# Patient Record
Sex: Female | Born: 1935 | Race: Black or African American | Hispanic: No | Smoking: Never smoker
Health system: Southern US, Community
[De-identification: ages and names within clinical notes are randomized; demographics above are authoritative.]

## PROBLEM LIST (undated history)

## (undated) DIAGNOSIS — M1990 Unspecified osteoarthritis, unspecified site: Secondary | ICD-10-CM

## (undated) DIAGNOSIS — K5790 Diverticulosis of intestine, part unspecified, without perforation or abscess without bleeding: Secondary | ICD-10-CM

## (undated) DIAGNOSIS — D649 Anemia, unspecified: Secondary | ICD-10-CM

## (undated) DIAGNOSIS — I509 Heart failure, unspecified: Secondary | ICD-10-CM

## (undated) DIAGNOSIS — C50919 Malignant neoplasm of unspecified site of unspecified female breast: Secondary | ICD-10-CM

## (undated) DIAGNOSIS — N200 Calculus of kidney: Secondary | ICD-10-CM

## (undated) DIAGNOSIS — Z9889 Other specified postprocedural states: Secondary | ICD-10-CM

## (undated) DIAGNOSIS — C169 Malignant neoplasm of stomach, unspecified: Secondary | ICD-10-CM

## (undated) DIAGNOSIS — E785 Hyperlipidemia, unspecified: Secondary | ICD-10-CM

## (undated) DIAGNOSIS — G4733 Obstructive sleep apnea (adult) (pediatric): Secondary | ICD-10-CM

## (undated) DIAGNOSIS — I10 Essential (primary) hypertension: Secondary | ICD-10-CM

## (undated) DIAGNOSIS — C9110 Chronic lymphocytic leukemia of B-cell type not having achieved remission: Secondary | ICD-10-CM

## (undated) DIAGNOSIS — K219 Gastro-esophageal reflux disease without esophagitis: Secondary | ICD-10-CM

## (undated) DIAGNOSIS — M109 Gout, unspecified: Secondary | ICD-10-CM

## (undated) DIAGNOSIS — Z8679 Personal history of other diseases of the circulatory system: Secondary | ICD-10-CM

## (undated) HISTORY — DX: Malignant neoplasm of unspecified site of unspecified female breast: C50.919

## (undated) HISTORY — DX: Obstructive sleep apnea (adult) (pediatric): G47.33

## (undated) HISTORY — DX: Diverticulosis of intestine, part unspecified, without perforation or abscess without bleeding: K57.90

## (undated) HISTORY — DX: Gastro-esophageal reflux disease without esophagitis: K21.9

## (undated) HISTORY — DX: Other specified postprocedural states: Z98.890

## (undated) HISTORY — PX: LITHOTRIPSY: SUR834

## (undated) HISTORY — DX: Heart failure, unspecified: I50.9

## (undated) HISTORY — PX: BREAST LUMPECTOMY: SHX2

## (undated) HISTORY — DX: Calculus of kidney: N20.0

## (undated) HISTORY — DX: Personal history of other diseases of the circulatory system: Z86.79

## (undated) HISTORY — DX: Unspecified osteoarthritis, unspecified site: M19.90

## (undated) HISTORY — DX: Essential (primary) hypertension: I10

## (undated) HISTORY — DX: Chronic lymphocytic leukemia of B-cell type not having achieved remission: C91.10

## (undated) HISTORY — PX: TOTAL ABDOMINAL HYSTERECTOMY W/ BILATERAL SALPINGOOPHORECTOMY: SHX83

## (undated) HISTORY — PX: PARTIAL HIP ARTHROPLASTY: SHX733

## (undated) HISTORY — DX: Hyperlipidemia, unspecified: E78.5

## (undated) HISTORY — DX: Gout, unspecified: M10.9

## (undated) HISTORY — PX: ABDOMINAL HYSTERECTOMY: SHX81

## (undated) HISTORY — PX: PORTACATH PLACEMENT: SHX2246

## (undated) HISTORY — PX: CATARACT EXTRACTION, BILATERAL: SHX1313

## (undated) HISTORY — DX: Malignant neoplasm of stomach, unspecified: C16.9

---

## 2005-05-12 ENCOUNTER — Ambulatory Visit: Payer: Self-pay | Admitting: Internal Medicine

## 2005-07-05 ENCOUNTER — Ambulatory Visit: Payer: Self-pay | Admitting: Internal Medicine

## 2005-12-06 ENCOUNTER — Ambulatory Visit: Payer: Self-pay | Admitting: Internal Medicine

## 2006-01-03 ENCOUNTER — Ambulatory Visit: Payer: Self-pay | Admitting: Internal Medicine

## 2006-04-12 ENCOUNTER — Ambulatory Visit: Payer: Self-pay | Admitting: Gastroenterology

## 2006-05-23 ENCOUNTER — Ambulatory Visit: Payer: Self-pay | Admitting: Internal Medicine

## 2006-07-09 ENCOUNTER — Ambulatory Visit: Payer: Self-pay | Admitting: Internal Medicine

## 2007-04-11 ENCOUNTER — Ambulatory Visit: Payer: Self-pay | Admitting: Internal Medicine

## 2007-06-10 ENCOUNTER — Ambulatory Visit: Payer: Self-pay | Admitting: Internal Medicine

## 2007-06-17 ENCOUNTER — Ambulatory Visit: Payer: Self-pay | Admitting: Internal Medicine

## 2007-12-16 ENCOUNTER — Ambulatory Visit: Payer: Self-pay | Admitting: Internal Medicine

## 2007-12-24 ENCOUNTER — Ambulatory Visit: Payer: Self-pay | Admitting: Internal Medicine

## 2008-01-02 ENCOUNTER — Ambulatory Visit: Payer: Self-pay | Admitting: Internal Medicine

## 2008-02-11 ENCOUNTER — Ambulatory Visit: Payer: Self-pay | Admitting: Urology

## 2008-03-10 ENCOUNTER — Other Ambulatory Visit: Payer: Self-pay

## 2008-03-10 ENCOUNTER — Ambulatory Visit: Payer: Self-pay | Admitting: Urology

## 2008-03-18 ENCOUNTER — Ambulatory Visit: Payer: Self-pay | Admitting: Urology

## 2008-03-23 ENCOUNTER — Ambulatory Visit: Payer: Self-pay | Admitting: Urology

## 2008-04-02 ENCOUNTER — Ambulatory Visit: Payer: Self-pay | Admitting: Urology

## 2008-04-13 ENCOUNTER — Ambulatory Visit: Payer: Self-pay | Admitting: Urology

## 2008-04-14 ENCOUNTER — Ambulatory Visit: Payer: Self-pay | Admitting: Internal Medicine

## 2008-04-21 ENCOUNTER — Ambulatory Visit: Payer: Self-pay | Admitting: Urology

## 2008-05-04 ENCOUNTER — Ambulatory Visit: Payer: Self-pay | Admitting: Urology

## 2008-05-14 ENCOUNTER — Ambulatory Visit: Payer: Self-pay | Admitting: Urology

## 2008-05-21 ENCOUNTER — Ambulatory Visit: Payer: Self-pay | Admitting: Urology

## 2008-06-02 ENCOUNTER — Ambulatory Visit: Payer: Self-pay | Admitting: Urology

## 2008-06-18 ENCOUNTER — Ambulatory Visit: Payer: Self-pay | Admitting: Urology

## 2008-06-25 ENCOUNTER — Ambulatory Visit: Payer: Self-pay | Admitting: Urology

## 2008-06-29 ENCOUNTER — Ambulatory Visit: Payer: Self-pay | Admitting: Internal Medicine

## 2008-07-09 ENCOUNTER — Ambulatory Visit: Payer: Self-pay | Admitting: Urology

## 2008-08-10 ENCOUNTER — Ambulatory Visit: Payer: Self-pay | Admitting: Urology

## 2008-09-05 ENCOUNTER — Emergency Department: Payer: Self-pay | Admitting: Emergency Medicine

## 2008-12-21 ENCOUNTER — Ambulatory Visit: Payer: Self-pay | Admitting: Urology

## 2009-01-26 ENCOUNTER — Ambulatory Visit: Payer: Self-pay | Admitting: Urology

## 2009-06-02 ENCOUNTER — Ambulatory Visit: Payer: Self-pay | Admitting: Family Medicine

## 2009-06-18 ENCOUNTER — Ambulatory Visit: Payer: Self-pay | Admitting: Orthopedic Surgery

## 2009-07-06 ENCOUNTER — Inpatient Hospital Stay: Payer: Self-pay | Admitting: Orthopedic Surgery

## 2009-09-02 ENCOUNTER — Ambulatory Visit: Payer: Self-pay | Admitting: Family Medicine

## 2010-01-21 ENCOUNTER — Ambulatory Visit: Payer: Self-pay | Admitting: Urology

## 2011-01-23 ENCOUNTER — Ambulatory Visit: Payer: Self-pay | Admitting: Urology

## 2011-04-12 ENCOUNTER — Ambulatory Visit: Payer: Self-pay | Admitting: Ophthalmology

## 2011-05-17 ENCOUNTER — Ambulatory Visit: Payer: Self-pay | Admitting: Ophthalmology

## 2011-09-11 ENCOUNTER — Ambulatory Visit: Payer: Self-pay | Admitting: Internal Medicine

## 2011-09-25 ENCOUNTER — Inpatient Hospital Stay: Payer: Self-pay | Admitting: Specialist

## 2011-09-25 LAB — COMPREHENSIVE METABOLIC PANEL
Alkaline Phosphatase: 60 U/L (ref 50–136)
Anion Gap: 13 (ref 7–16)
BUN: 22 mg/dL — ABNORMAL HIGH (ref 7–18)
Bilirubin,Total: 0.9 mg/dL (ref 0.2–1.0)
Calcium, Total: 9.5 mg/dL (ref 8.5–10.1)
Creatinine: 1.98 mg/dL — ABNORMAL HIGH (ref 0.60–1.30)
EGFR (Non-African Amer.): 23 — ABNORMAL LOW
Glucose: 125 mg/dL — ABNORMAL HIGH (ref 65–99)
Osmolality: 290 (ref 275–301)
SGPT (ALT): 11 U/L — ABNORMAL LOW
Sodium: 143 mmol/L (ref 136–145)

## 2011-09-25 LAB — URINALYSIS, COMPLETE
Glucose,UR: NEGATIVE mg/dL (ref 0–75)
RBC,UR: 17 /HPF (ref 0–5)
Squamous Epithelial: 4
WBC UR: 701 /HPF (ref 0–5)

## 2011-09-25 LAB — LIPASE, BLOOD: Lipase: 105 U/L (ref 73–393)

## 2011-09-25 LAB — CBC
HCT: 31.1 % — ABNORMAL LOW (ref 35.0–47.0)
HGB: 10 g/dL — ABNORMAL LOW (ref 12.0–16.0)
MCH: 25.6 pg — ABNORMAL LOW (ref 26.0–34.0)
MCHC: 32.2 g/dL (ref 32.0–36.0)
RDW: 18.3 % — ABNORMAL HIGH (ref 11.5–14.5)
WBC: 21.7 10*3/uL — ABNORMAL HIGH (ref 3.6–11.0)

## 2011-09-25 LAB — TROPONIN I: Troponin-I: <0.02 ng/mL

## 2011-09-26 LAB — CBC WITH DIFFERENTIAL/PLATELET
Basophil #: 0 10*3/uL (ref 0.0–0.1)
Basophil %: 0.1 %
Eosinophil #: 0 10*3/uL (ref 0.0–0.7)
HCT: 31.6 % — ABNORMAL LOW (ref 35.0–47.0)
Lymphocyte #: 0.9 10*3/uL — ABNORMAL LOW (ref 1.0–3.6)
MCH: 25.4 pg — ABNORMAL LOW (ref 26.0–34.0)
MCHC: 31.2 g/dL — ABNORMAL LOW (ref 32.0–36.0)
MCV: 81 fL (ref 80–100)
Monocyte %: 4.5 %
Neutrophil #: 17.4 10*3/uL — ABNORMAL HIGH (ref 1.4–6.5)
Platelet: 135 10*3/uL — ABNORMAL LOW (ref 150–440)
RBC: 3.88 10*6/uL (ref 3.80–5.20)
RDW: 18.5 % — ABNORMAL HIGH (ref 11.5–14.5)
WBC: 19.1 10*3/uL — ABNORMAL HIGH (ref 3.6–11.0)

## 2011-09-26 LAB — BASIC METABOLIC PANEL
Anion Gap: 10 (ref 7–16)
Chloride: 111 mmol/L — ABNORMAL HIGH (ref 98–107)
Creatinine: 1.43 mg/dL — ABNORMAL HIGH (ref 0.60–1.30)
EGFR (African American): 39 — ABNORMAL LOW
EGFR (Non-African Amer.): 34 — ABNORMAL LOW
Glucose: 103 mg/dL — ABNORMAL HIGH (ref 65–99)
Potassium: 3.9 mmol/L (ref 3.5–5.1)
Sodium: 143 mmol/L (ref 136–145)

## 2011-09-26 LAB — MAGNESIUM: Magnesium: 1.8 mg/dL

## 2011-09-27 LAB — CBC WITH DIFFERENTIAL/PLATELET
Basophil #: 0 10*3/uL (ref 0.0–0.1)
Basophil %: 0.2 %
Eosinophil #: 0.1 10*3/uL (ref 0.0–0.7)
Eosinophil %: 1.2 %
HCT: 29.3 % — ABNORMAL LOW (ref 35.0–47.0)
HGB: 9.4 g/dL — ABNORMAL LOW (ref 12.0–16.0)
Lymphocyte #: 1 10*3/uL (ref 1.0–3.6)
Lymphocyte %: 9.8 %
MCH: 25.7 pg — ABNORMAL LOW (ref 26.0–34.0)
MCHC: 32.1 g/dL (ref 32.0–36.0)
MCV: 80 fL (ref 80–100)
Monocyte #: 0.8 x10 3/mm (ref 0.2–0.9)
Monocyte %: 8.4 %
Neutrophil #: 7.9 10*3/uL — ABNORMAL HIGH (ref 1.4–6.5)
Neutrophil %: 80.4 %
Platelet: 135 10*3/uL — ABNORMAL LOW (ref 150–440)
RBC: 3.65 10*6/uL — ABNORMAL LOW (ref 3.80–5.20)
RDW: 18.4 % — ABNORMAL HIGH (ref 11.5–14.5)
WBC: 9.9 10*3/uL (ref 3.6–11.0)

## 2011-09-27 LAB — BASIC METABOLIC PANEL
Anion Gap: 13 (ref 7–16)
BUN: 19 mg/dL — ABNORMAL HIGH (ref 7–18)
Calcium, Total: 8.9 mg/dL (ref 8.5–10.1)
Chloride: 110 mmol/L — ABNORMAL HIGH (ref 98–107)
Co2: 21 mmol/L (ref 21–32)
Creatinine: 1.36 mg/dL — ABNORMAL HIGH (ref 0.60–1.30)
EGFR (African American): 42 — ABNORMAL LOW
EGFR (Non-African Amer.): 36 — ABNORMAL LOW
Glucose: 88 mg/dL (ref 65–99)
Osmolality: 289 (ref 275–301)
Potassium: 4.1 mmol/L (ref 3.5–5.1)
Sodium: 144 mmol/L (ref 136–145)

## 2011-09-28 LAB — CBC WITH DIFFERENTIAL/PLATELET
Basophil #: 0 10*3/uL (ref 0.0–0.1)
Basophil %: 0.1 %
Eosinophil #: 0 10*3/uL (ref 0.0–0.7)
Eosinophil %: 0 %
HCT: 33 % — ABNORMAL LOW (ref 35.0–47.0)
HGB: 10.5 g/dL — ABNORMAL LOW (ref 12.0–16.0)
MCH: 25.8 pg — ABNORMAL LOW (ref 26.0–34.0)
MCHC: 31.9 g/dL — ABNORMAL LOW (ref 32.0–36.0)
Monocyte #: 0.2 x10 3/mm (ref 0.2–0.9)
Monocyte %: 2.4 %
Neutrophil #: 9.4 10*3/uL — ABNORMAL HIGH (ref 1.4–6.5)
Neutrophil %: 91.1 %
Platelet: 174 10*3/uL (ref 150–440)
RBC: 4.07 10*6/uL (ref 3.80–5.20)
RDW: 18.6 % — ABNORMAL HIGH (ref 11.5–14.5)
WBC: 10.4 10*3/uL (ref 3.6–11.0)

## 2011-09-28 LAB — BASIC METABOLIC PANEL
Anion Gap: 12 (ref 7–16)
Calcium, Total: 9.5 mg/dL (ref 8.5–10.1)
Chloride: 113 mmol/L — ABNORMAL HIGH (ref 98–107)
Co2: 18 mmol/L — ABNORMAL LOW (ref 21–32)
EGFR (African American): 42 — ABNORMAL LOW
Potassium: 4.6 mmol/L (ref 3.5–5.1)

## 2011-09-29 LAB — CULTURE, BLOOD (SINGLE)

## 2011-10-01 LAB — CULTURE, BLOOD (SINGLE)

## 2011-10-02 ENCOUNTER — Ambulatory Visit: Payer: Self-pay | Admitting: Internal Medicine

## 2011-10-03 LAB — PATHOLOGY REPORT

## 2011-10-03 LAB — CULTURE, BLOOD (SINGLE)

## 2011-10-10 LAB — CBC CANCER CENTER
Basophil #: 0.1 x10 3/mm (ref 0.0–0.1)
HGB: 10.5 g/dL — ABNORMAL LOW (ref 12.0–16.0)
MCH: 25.8 pg — ABNORMAL LOW (ref 26.0–34.0)
MCHC: 32 g/dL (ref 32.0–36.0)
Monocyte #: 0.6 x10 3/mm (ref 0.2–0.9)
Monocyte %: 12.4 %
Monocytes: 10 %
Neutrophil #: 2.7 x10 3/mm (ref 1.4–6.5)
RBC: 4.08 10*6/uL (ref 3.80–5.20)
RDW: 18.9 % — ABNORMAL HIGH (ref 11.5–14.5)
Segmented Neutrophils: 47 %
Variant Lymphocyte: 2 %

## 2011-10-11 ENCOUNTER — Ambulatory Visit: Payer: Self-pay | Admitting: Internal Medicine

## 2011-10-17 ENCOUNTER — Ambulatory Visit: Payer: Self-pay | Admitting: Urology

## 2011-10-19 LAB — URINE CULTURE

## 2011-10-24 LAB — URIC ACID: Uric Acid: 5.2 mg/dL (ref 2.6–6.0)

## 2011-10-24 LAB — CBC CANCER CENTER
Basophil #: 0 x10 3/mm (ref 0.0–0.1)
Eosinophil #: 0.3 x10 3/mm (ref 0.0–0.7)
Eosinophil %: 6 %
MCHC: 31.8 g/dL — ABNORMAL LOW (ref 32.0–36.0)
MCV: 81 fL (ref 80–100)
Neutrophil #: 2.4 x10 3/mm (ref 1.4–6.5)
Neutrophil %: 55.5 %
Platelet: 197 x10 3/mm (ref 150–440)
RBC: 4.42 10*6/uL (ref 3.80–5.20)

## 2011-10-24 LAB — BASIC METABOLIC PANEL
Anion Gap: 11 (ref 7–16)
BUN: 16 mg/dL (ref 7–18)
Co2: 25 mmol/L (ref 21–32)
EGFR (African American): 48 — ABNORMAL LOW
EGFR (Non-African Amer.): 42 — ABNORMAL LOW
Glucose: 87 mg/dL (ref 65–99)
Potassium: 4.3 mmol/L (ref 3.5–5.1)
Sodium: 143 mmol/L (ref 136–145)

## 2011-10-24 LAB — HEPATIC FUNCTION PANEL A (ARMC)
Alkaline Phosphatase: 91 U/L (ref 50–136)
Bilirubin, Direct: 0.2 mg/dL (ref 0.00–0.20)
Bilirubin,Total: 0.5 mg/dL (ref 0.2–1.0)
SGPT (ALT): 23 U/L

## 2011-11-01 LAB — BASIC METABOLIC PANEL
BUN: 16 mg/dL (ref 7–18)
Co2: 23 mmol/L (ref 21–32)
Creatinine: 1.3 mg/dL (ref 0.60–1.30)
EGFR (African American): 44 — ABNORMAL LOW
EGFR (Non-African Amer.): 38 — ABNORMAL LOW
Glucose: 93 mg/dL (ref 65–99)
Potassium: 4.4 mmol/L (ref 3.5–5.1)

## 2011-11-01 LAB — CBC CANCER CENTER
Basophil %: 1.2 %
Eosinophil #: 0.1 x10 3/mm (ref 0.0–0.7)
Eosinophil %: 2.3 %
HCT: 32.7 % — ABNORMAL LOW (ref 35.0–47.0)
HGB: 10.5 g/dL — ABNORMAL LOW (ref 12.0–16.0)
Lymphocyte #: 1.4 x10 3/mm (ref 1.0–3.6)
Lymphocyte %: 26.2 %
MCV: 81 fL (ref 80–100)
Monocyte #: 0.6 x10 3/mm (ref 0.2–0.9)
Neutrophil #: 3.1 x10 3/mm (ref 1.4–6.5)
WBC: 5.2 x10 3/mm (ref 3.6–11.0)

## 2011-11-02 ENCOUNTER — Ambulatory Visit: Payer: Self-pay | Admitting: Urology

## 2011-11-03 LAB — URINE CULTURE

## 2011-11-08 LAB — CBC CANCER CENTER
Basophil %: 0.6 %
Eosinophil #: 0.1 x10 3/mm (ref 0.0–0.7)
HCT: 36.9 % (ref 35.0–47.0)
HGB: 11.7 g/dL — ABNORMAL LOW (ref 12.0–16.0)
Lymphocyte #: 1.2 x10 3/mm (ref 1.0–3.6)
Lymphocyte %: 25 %
MCHC: 31.7 g/dL — ABNORMAL LOW (ref 32.0–36.0)
Monocyte #: 0.5 x10 3/mm (ref 0.2–0.9)
Monocyte %: 10.1 %
Neutrophil #: 3.1 x10 3/mm (ref 1.4–6.5)
Neutrophil %: 62.4 %
Platelet: 207 x10 3/mm (ref 150–440)
RBC: 4.5 10*6/uL (ref 3.80–5.20)
RDW: 19.6 % — ABNORMAL HIGH (ref 11.5–14.5)
WBC: 4.9 x10 3/mm (ref 3.6–11.0)

## 2011-11-08 LAB — BASIC METABOLIC PANEL
BUN: 14 mg/dL (ref 7–18)
Calcium, Total: 9.7 mg/dL (ref 8.5–10.1)
EGFR (African American): 46 — ABNORMAL LOW
EGFR (Non-African Amer.): 40 — ABNORMAL LOW
Glucose: 84 mg/dL (ref 65–99)
Osmolality: 290 (ref 275–301)
Potassium: 4.1 mmol/L (ref 3.5–5.1)
Sodium: 146 mmol/L — ABNORMAL HIGH (ref 136–145)

## 2011-11-11 ENCOUNTER — Ambulatory Visit: Payer: Self-pay | Admitting: Internal Medicine

## 2011-11-14 ENCOUNTER — Ambulatory Visit: Payer: Self-pay | Admitting: Urology

## 2011-11-15 LAB — BASIC METABOLIC PANEL
Anion Gap: 7 (ref 7–16)
BUN: 14 mg/dL (ref 7–18)
Calcium, Total: 9.9 mg/dL (ref 8.5–10.1)
EGFR (African American): 46 — ABNORMAL LOW
EGFR (Non-African Amer.): 40 — ABNORMAL LOW
Glucose: 86 mg/dL (ref 65–99)
Potassium: 4.5 mmol/L (ref 3.5–5.1)

## 2011-11-15 LAB — CBC CANCER CENTER
Basophil #: 0 x10 3/mm (ref 0.0–0.1)
Eosinophil %: 2.9 %
HGB: 11.8 g/dL — ABNORMAL LOW (ref 12.0–16.0)
Lymphocyte #: 1.2 x10 3/mm (ref 1.0–3.6)
Lymphocyte %: 26.3 %
MCHC: 31.7 g/dL — ABNORMAL LOW (ref 32.0–36.0)
Monocyte %: 10.6 %
Neutrophil #: 2.6 x10 3/mm (ref 1.4–6.5)
RBC: 4.47 10*6/uL (ref 3.80–5.20)
RDW: 19.2 % — ABNORMAL HIGH (ref 11.5–14.5)
WBC: 4.4 x10 3/mm (ref 3.6–11.0)

## 2011-11-22 LAB — CBC CANCER CENTER
Basophil %: 0.8 %
Eosinophil #: 0.1 x10 3/mm (ref 0.0–0.7)
Eosinophil %: 2.5 %
HCT: 34.2 % — ABNORMAL LOW (ref 35.0–47.0)
HGB: 10.8 g/dL — ABNORMAL LOW (ref 12.0–16.0)
Lymphocyte #: 1 x10 3/mm (ref 1.0–3.6)
MCH: 26 pg (ref 26.0–34.0)
Monocyte #: 0.5 x10 3/mm (ref 0.2–0.9)
Monocyte %: 11.4 %
Neutrophil #: 2.8 x10 3/mm (ref 1.4–6.5)
Neutrophil %: 62.9 %
RBC: 4.16 10*6/uL (ref 3.80–5.20)

## 2011-11-28 ENCOUNTER — Ambulatory Visit: Payer: Self-pay | Admitting: Vascular Surgery

## 2011-11-29 LAB — CBC CANCER CENTER
Basophil #: 0 x10 3/mm (ref 0.0–0.1)
Basophil %: 0.7 %
Eosinophil #: 0.1 x10 3/mm (ref 0.0–0.7)
HCT: 34.7 % — ABNORMAL LOW (ref 35.0–47.0)
HGB: 11 g/dL — ABNORMAL LOW (ref 12.0–16.0)
Lymphocyte %: 22.6 %
MCH: 26.1 pg (ref 26.0–34.0)
MCHC: 31.8 g/dL — ABNORMAL LOW (ref 32.0–36.0)
Monocyte #: 0.7 x10 3/mm (ref 0.2–0.9)
Monocyte %: 12.6 %
Neutrophil #: 3.6 x10 3/mm (ref 1.4–6.5)
WBC: 5.8 x10 3/mm (ref 3.6–11.0)

## 2011-12-06 LAB — CBC CANCER CENTER
Basophil %: 0.5 %
Eosinophil #: 0.1 x10 3/mm (ref 0.0–0.7)
Eosinophil %: 2.9 %
HCT: 31.4 % — ABNORMAL LOW (ref 35.0–47.0)
MCV: 83 fL (ref 80–100)
Monocyte #: 0.6 x10 3/mm (ref 0.2–0.9)
Monocyte %: 11.7 %
Neutrophil #: 3 x10 3/mm (ref 1.4–6.5)
RBC: 3.79 10*6/uL — ABNORMAL LOW (ref 3.80–5.20)
RDW: 18.6 % — ABNORMAL HIGH (ref 11.5–14.5)

## 2011-12-06 LAB — BASIC METABOLIC PANEL
Anion Gap: 8 (ref 7–16)
Calcium, Total: 9.1 mg/dL (ref 8.5–10.1)
Co2: 25 mmol/L (ref 21–32)
Creatinine: 1.15 mg/dL (ref 0.60–1.30)
EGFR (African American): 51 — ABNORMAL LOW
Glucose: 99 mg/dL (ref 65–99)
Osmolality: 287 (ref 275–301)
Potassium: 4.1 mmol/L (ref 3.5–5.1)
Sodium: 145 mmol/L (ref 136–145)

## 2011-12-11 ENCOUNTER — Ambulatory Visit: Payer: Self-pay | Admitting: Internal Medicine

## 2011-12-20 LAB — CBC CANCER CENTER
Basophil #: 0 x10 3/mm (ref 0.0–0.1)
Eosinophil #: 0.1 x10 3/mm (ref 0.0–0.7)
HCT: 30.7 % — ABNORMAL LOW (ref 35.0–47.0)
MCH: 26.2 pg (ref 26.0–34.0)
MCHC: 31.7 g/dL — ABNORMAL LOW (ref 32.0–36.0)
MCV: 83 fL (ref 80–100)
RDW: 18.2 % — ABNORMAL HIGH (ref 11.5–14.5)

## 2012-01-11 ENCOUNTER — Ambulatory Visit: Payer: Self-pay | Admitting: Internal Medicine

## 2012-01-17 LAB — CBC CANCER CENTER
HCT: 33.6 % — ABNORMAL LOW (ref 35.0–47.0)
Lymphocyte #: 1.1 x10 3/mm (ref 1.0–3.6)
MCHC: 32.9 g/dL (ref 32.0–36.0)
MCV: 83 fL (ref 80–100)
Neutrophil #: 2.1 x10 3/mm (ref 1.4–6.5)
RDW: 17.4 % — ABNORMAL HIGH (ref 11.5–14.5)

## 2012-01-23 ENCOUNTER — Ambulatory Visit: Payer: Self-pay | Admitting: Urology

## 2012-02-11 ENCOUNTER — Ambulatory Visit: Payer: Self-pay | Admitting: Internal Medicine

## 2012-02-23 LAB — CBC CANCER CENTER
Basophil #: 0 x10 3/mm (ref 0.0–0.1)
Basophil %: 0.5 %
Eosinophil #: 0.1 x10 3/mm (ref 0.0–0.7)
Eosinophil %: 2 %
HCT: 33.9 % — ABNORMAL LOW (ref 35.0–47.0)
Lymphocyte #: 0.9 x10 3/mm — ABNORMAL LOW (ref 1.0–3.6)
MCH: 26.3 pg (ref 26.0–34.0)
MCHC: 32 g/dL (ref 32.0–36.0)
MCV: 82 fL (ref 80–100)
Monocyte #: 0.8 x10 3/mm (ref 0.2–0.9)
Platelet: 204 x10 3/mm (ref 150–440)
RBC: 4.12 10*6/uL (ref 3.80–5.20)
RDW: 16.9 % — ABNORMAL HIGH (ref 11.5–14.5)

## 2012-02-23 LAB — HEPATIC FUNCTION PANEL A (ARMC)
Alkaline Phosphatase: 75 U/L (ref 50–136)
Bilirubin,Total: 0.4 mg/dL (ref 0.2–1.0)
SGOT(AST): 18 U/L (ref 15–37)
SGPT (ALT): 15 U/L (ref 12–78)

## 2012-02-23 LAB — CREATININE, SERUM
EGFR (African American): 41 — ABNORMAL LOW
EGFR (Non-African Amer.): 35 — ABNORMAL LOW

## 2012-03-12 ENCOUNTER — Ambulatory Visit: Payer: Self-pay

## 2012-03-12 ENCOUNTER — Ambulatory Visit: Payer: Self-pay | Admitting: Internal Medicine

## 2012-04-19 ENCOUNTER — Ambulatory Visit: Payer: Self-pay | Admitting: Urology

## 2012-05-17 ENCOUNTER — Ambulatory Visit: Payer: Self-pay | Admitting: Internal Medicine

## 2012-05-22 ENCOUNTER — Ambulatory Visit: Payer: Self-pay | Admitting: Internal Medicine

## 2012-05-22 LAB — HEPATIC FUNCTION PANEL A (ARMC)
Albumin: 4 g/dL (ref 3.4–5.0)
Alkaline Phosphatase: 91 U/L (ref 50–136)
Bilirubin,Total: 0.4 mg/dL (ref 0.2–1.0)
SGOT(AST): 13 U/L — ABNORMAL LOW (ref 15–37)
SGPT (ALT): 15 U/L (ref 12–78)
Total Protein: 8.4 g/dL — ABNORMAL HIGH (ref 6.4–8.2)

## 2012-05-22 LAB — CBC CANCER CENTER
Basophil %: 0.9 %
Eosinophil #: 0.1 x10 3/mm (ref 0.0–0.7)
Eosinophil %: 2.3 %
Lymphocyte %: 15.6 %
MCHC: 33.2 g/dL (ref 32.0–36.0)
Monocyte #: 0.6 x10 3/mm (ref 0.2–0.9)
Neutrophil #: 3.9 x10 3/mm (ref 1.4–6.5)
Neutrophil %: 70.8 %
RBC: 4.95 10*6/uL (ref 3.80–5.20)
RDW: 18 % — ABNORMAL HIGH (ref 11.5–14.5)

## 2012-05-22 LAB — CREATININE, SERUM
Creatinine: 1.28 mg/dL (ref 0.60–1.30)
EGFR (African American): 45 — ABNORMAL LOW
EGFR (Non-African Amer.): 39 — ABNORMAL LOW

## 2012-05-22 LAB — LACTATE DEHYDROGENASE: LDH: 174 U/L (ref 81–246)

## 2012-06-12 ENCOUNTER — Ambulatory Visit: Payer: Self-pay | Admitting: Internal Medicine

## 2012-06-17 LAB — CBC CANCER CENTER
Basophil #: 0.1 x10 3/mm (ref 0.0–0.1)
Basophil %: 1.5 %
Eosinophil #: 0.1 x10 3/mm (ref 0.0–0.7)
HGB: 11 g/dL — ABNORMAL LOW (ref 12.0–16.0)
Lymphocyte #: 0.9 x10 3/mm — ABNORMAL LOW (ref 1.0–3.6)
MCHC: 33 g/dL (ref 32.0–36.0)
MCV: 81 fL (ref 80–100)
Monocyte %: 14.3 %
Neutrophil #: 2.3 x10 3/mm (ref 1.4–6.5)
Neutrophil %: 58.7 %
Platelet: 190 x10 3/mm (ref 150–440)
RDW: 18.5 % — ABNORMAL HIGH (ref 11.5–14.5)
WBC: 3.9 x10 3/mm (ref 3.6–11.0)

## 2012-06-17 LAB — CREATININE, SERUM
Creatinine: 1.35 mg/dL — ABNORMAL HIGH (ref 0.60–1.30)
EGFR (African American): 42 — ABNORMAL LOW
EGFR (Non-African Amer.): 36 — ABNORMAL LOW

## 2012-06-17 LAB — HEPATIC FUNCTION PANEL A (ARMC)
Albumin: 3.4 g/dL (ref 3.4–5.0)
Alkaline Phosphatase: 84 U/L (ref 50–136)
Bilirubin, Direct: <0.05 mg/dL (ref 0.00–0.20)
SGOT(AST): 12 U/L — ABNORMAL LOW (ref 15–37)
SGPT (ALT): 18 U/L (ref 12–78)

## 2012-06-23 ENCOUNTER — Emergency Department: Payer: Self-pay | Admitting: Emergency Medicine

## 2012-06-23 LAB — COMPREHENSIVE METABOLIC PANEL
Anion Gap: 8 (ref 7–16)
BUN: 16 mg/dL (ref 7–18)
Bilirubin,Total: 0.4 mg/dL (ref 0.2–1.0)
Co2: 22 mmol/L (ref 21–32)
EGFR (African American): 53 — ABNORMAL LOW
EGFR (Non-African Amer.): 46 — ABNORMAL LOW
Glucose: 99 mg/dL (ref 65–99)
Osmolality: 282 (ref 275–301)
SGOT(AST): 33 U/L (ref 15–37)
SGPT (ALT): 15 U/L (ref 12–78)
Total Protein: 7.2 g/dL (ref 6.4–8.2)

## 2012-06-23 LAB — CBC
MCH: 26.2 pg (ref 26.0–34.0)
MCHC: 32.7 g/dL (ref 32.0–36.0)
Platelet: 181 10*3/uL (ref 150–440)
RBC: 4.18 10*6/uL (ref 3.80–5.20)

## 2012-06-23 LAB — TROPONIN I: Troponin-I: <0.02 ng/mL

## 2012-06-24 LAB — CBC CANCER CENTER
Basophil #: 0 x10 3/mm (ref 0.0–0.1)
Basophil %: 0.2 %
Eosinophil #: 0.5 x10 3/mm (ref 0.0–0.7)
HGB: 11.6 g/dL — ABNORMAL LOW (ref 12.0–16.0)
MCHC: 32.7 g/dL (ref 32.0–36.0)
Neutrophil #: 23.7 x10 3/mm — ABNORMAL HIGH (ref 1.4–6.5)
Platelet: 153 x10 3/mm (ref 150–440)
RBC: 4.46 10*6/uL (ref 3.80–5.20)
RDW: 19.2 % — ABNORMAL HIGH (ref 11.5–14.5)
WBC: 26.4 x10 3/mm — ABNORMAL HIGH (ref 3.6–11.0)

## 2012-07-01 LAB — HEPATIC FUNCTION PANEL A (ARMC)
Albumin: 3.5 g/dL (ref 3.4–5.0)
Alkaline Phosphatase: 120 U/L (ref 50–136)
Bilirubin, Direct: 0.1 mg/dL (ref 0.00–0.20)
SGOT(AST): 18 U/L (ref 15–37)
SGPT (ALT): 12 U/L (ref 12–78)

## 2012-07-01 LAB — BASIC METABOLIC PANEL
Anion Gap: 9 (ref 7–16)
BUN: 16 mg/dL (ref 7–18)
Calcium, Total: 9.5 mg/dL (ref 8.5–10.1)
Chloride: 108 mmol/L — ABNORMAL HIGH (ref 98–107)
Co2: 27 mmol/L (ref 21–32)
EGFR (Non-African Amer.): 36 — ABNORMAL LOW
Osmolality: 288 (ref 275–301)
Sodium: 144 mmol/L (ref 136–145)

## 2012-07-01 LAB — CBC CANCER CENTER
Basophil %: 0.4 %
Eosinophil #: 0.2 x10 3/mm (ref 0.0–0.7)
HCT: 34.9 % — ABNORMAL LOW (ref 35.0–47.0)
HGB: 11.5 g/dL — ABNORMAL LOW (ref 12.0–16.0)
Lymphocyte #: 0.4 x10 3/mm — ABNORMAL LOW (ref 1.0–3.6)
Lymphocyte %: 4.8 %
MCH: 26.3 pg (ref 26.0–34.0)
MCHC: 33.1 g/dL (ref 32.0–36.0)
MCV: 80 fL (ref 80–100)
Monocyte #: 0.9 x10 3/mm (ref 0.2–0.9)
RBC: 4.38 10*6/uL (ref 3.80–5.20)

## 2012-07-01 LAB — URIC ACID: Uric Acid: 6.1 mg/dL — ABNORMAL HIGH (ref 2.6–6.0)

## 2012-07-08 LAB — CBC CANCER CENTER
Basophil #: 0 x10 3/mm (ref 0.0–0.1)
Basophil %: 1 %
Eosinophil #: 0.2 x10 3/mm (ref 0.0–0.7)
HCT: 35.3 % (ref 35.0–47.0)
Lymphocyte #: 0.5 x10 3/mm — ABNORMAL LOW (ref 1.0–3.6)
MCH: 25.8 pg — ABNORMAL LOW (ref 26.0–34.0)
Monocyte #: 0.9 x10 3/mm (ref 0.2–0.9)
Neutrophil #: 1.8 x10 3/mm (ref 1.4–6.5)
Neutrophil %: 53.6 %
Platelet: 204 x10 3/mm (ref 150–440)
RBC: 4.38 10*6/uL (ref 3.80–5.20)
WBC: 3.4 x10 3/mm — ABNORMAL LOW (ref 3.6–11.0)

## 2012-07-13 ENCOUNTER — Ambulatory Visit: Payer: Self-pay | Admitting: Internal Medicine

## 2012-07-15 LAB — CBC CANCER CENTER
Basophil %: 1.3 %
Eosinophil #: 0.2 x10 3/mm (ref 0.0–0.7)
Eosinophil %: 5.9 %
HCT: 31.8 % — ABNORMAL LOW (ref 35.0–47.0)
HGB: 10.5 g/dL — ABNORMAL LOW (ref 12.0–16.0)
Lymphocyte #: 0.5 x10 3/mm — ABNORMAL LOW (ref 1.0–3.6)
Lymphocyte %: 15.5 %
Monocyte #: 0.8 x10 3/mm (ref 0.2–0.9)
Neutrophil #: 1.7 x10 3/mm (ref 1.4–6.5)
WBC: 3.2 x10 3/mm — ABNORMAL LOW (ref 3.6–11.0)

## 2012-07-15 LAB — BASIC METABOLIC PANEL
BUN: 16 mg/dL (ref 7–18)
Calcium, Total: 9.3 mg/dL (ref 8.5–10.1)
Chloride: 108 mmol/L — ABNORMAL HIGH (ref 98–107)
Co2: 26 mmol/L (ref 21–32)
EGFR (African American): 40 — ABNORMAL LOW
EGFR (Non-African Amer.): 34 — ABNORMAL LOW
Glucose: 92 mg/dL (ref 65–99)
Osmolality: 286 (ref 275–301)
Sodium: 143 mmol/L (ref 136–145)

## 2012-07-19 ENCOUNTER — Encounter: Payer: Self-pay | Admitting: *Deleted

## 2012-07-22 LAB — CBC CANCER CENTER
Basophil %: 0.9 %
Eosinophil #: 0.5 x10 3/mm (ref 0.0–0.7)
Eosinophil %: 3.1 %
HCT: 34.1 % — ABNORMAL LOW (ref 35.0–47.0)
HGB: 11.2 g/dL — ABNORMAL LOW (ref 12.0–16.0)
MCHC: 32.9 g/dL (ref 32.0–36.0)
MCV: 80 fL (ref 80–100)
Monocyte %: 10.5 %
Neutrophil #: 14.2 x10 3/mm — ABNORMAL HIGH (ref 1.4–6.5)
Platelet: 166 x10 3/mm (ref 150–440)
RDW: 19.4 % — ABNORMAL HIGH (ref 11.5–14.5)
WBC: 16.8 x10 3/mm — ABNORMAL HIGH (ref 3.6–11.0)

## 2012-07-23 ENCOUNTER — Encounter: Payer: Self-pay | Admitting: Cardiovascular Disease

## 2012-07-26 ENCOUNTER — Encounter: Payer: Self-pay | Admitting: Cardiovascular Disease

## 2012-07-29 ENCOUNTER — Encounter: Payer: Self-pay | Admitting: *Deleted

## 2012-07-29 LAB — CBC CANCER CENTER
Basophil #: 0 x10 3/mm (ref 0.0–0.1)
Basophil %: 0.2 %
Eosinophil #: 0.2 x10 3/mm (ref 0.0–0.7)
Eosinophil %: 2.2 %
HGB: 11.9 g/dL — ABNORMAL LOW (ref 12.0–16.0)
Lymphocyte #: 1.2 x10 3/mm (ref 1.0–3.6)
MCH: 26.4 pg (ref 26.0–34.0)
MCHC: 33 g/dL (ref 32.0–36.0)
MCV: 80 fL (ref 80–100)
Monocyte %: 8.8 %
Neutrophil #: 7.8 x10 3/mm — ABNORMAL HIGH (ref 1.4–6.5)
Platelet: 232 x10 3/mm (ref 150–440)
WBC: 10.1 x10 3/mm (ref 3.6–11.0)

## 2012-07-29 LAB — HEPATIC FUNCTION PANEL A (ARMC)
Albumin: 3.5 g/dL (ref 3.4–5.0)
Alkaline Phosphatase: 135 U/L (ref 50–136)
Bilirubin, Direct: 0.1 mg/dL (ref 0.00–0.20)
Bilirubin,Total: 0.3 mg/dL (ref 0.2–1.0)
SGPT (ALT): 17 U/L (ref 12–78)

## 2012-07-29 LAB — BASIC METABOLIC PANEL
Anion Gap: 8 (ref 7–16)
BUN: 17 mg/dL (ref 7–18)
Calcium, Total: 9.9 mg/dL (ref 8.5–10.1)
Chloride: 107 mmol/L (ref 98–107)
Creatinine: 1.31 mg/dL — ABNORMAL HIGH (ref 0.60–1.30)
EGFR (African American): 44 — ABNORMAL LOW
EGFR (Non-African Amer.): 38 — ABNORMAL LOW
Osmolality: 286 (ref 275–301)
Sodium: 143 mmol/L (ref 136–145)

## 2012-08-02 ENCOUNTER — Ambulatory Visit (INDEPENDENT_AMBULATORY_CARE_PROVIDER_SITE_OTHER): Payer: PRIVATE HEALTH INSURANCE | Admitting: Cardiovascular Disease

## 2012-08-02 ENCOUNTER — Encounter: Payer: Self-pay | Admitting: Cardiovascular Disease

## 2012-08-02 VITALS — BP 102/68 | HR 97 | Ht 62.0 in | Wt 164.2 lb

## 2012-08-02 DIAGNOSIS — I10 Essential (primary) hypertension: Secondary | ICD-10-CM | POA: Insufficient documentation

## 2012-08-02 DIAGNOSIS — R0600 Dyspnea, unspecified: Secondary | ICD-10-CM

## 2012-08-02 NOTE — Assessment & Plan Note (Signed)
The patient's dyspnea is worrisome for possible underlying heart failure. Currently though she reports significant improvement in her symptoms after using Lasix briefly as needed. There is no evidence of heart failure by physical exam today. Her BNP was greater than 1000 last month. She has no chest pain at the present time. I would proceed with an echocardiogram to evaluate LV systolic function. Further recommendations will be based on the results of the echocardiogram. She is currently receiving chemotherapy for lymphoma. Rituxan is known to have cardiac toxicity but not usually cardiomyopathy.

## 2012-08-02 NOTE — Patient Instructions (Addendum)
Your physician has requested that you have an echocardiogram. Echocardiography is a painless test that uses sound waves to create images of your heart. It provides your doctor with information about the size and shape of your heart and how well your heart's chambers and valves are working. This procedure takes approximately one hour. There are no restrictions for this procedure.  Follow up as needed 

## 2012-08-02 NOTE — Assessment & Plan Note (Signed)
Her blood pressure is controlled today. According to outpatient notes, she is supposed to be on carvedilol 25 mg twice daily. However, the patient does not have this on her own medications and did not recognize the name. She is currently on amlodipine 2.5 mg once daily. This will have to be verified with her.

## 2012-08-02 NOTE — Progress Notes (Signed)
Primary care physician: Dr. Ulanda Edison  HPI  This is an 79 year old African American female who was referred from the emergency room at [facility] for evaluation of dyspnea with elevated BNP and possible heart failure. 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 Sherrlyn Hock. She was also diagnosed with refractory anemia with ring sideroblasts, myelodysplastic syndrome by bone marrow biopsy on Oct 11, 2011 .  She had previous history of breast cancer more than 5 years ago, treated outside. Per the patient, she had surgery and chemotherapy and has been in remission. She was started on single-agent Rituxan on 10/25/11. In Dec 2013, she was found to have progressive disease. Started Treanda/Rituxan on 06/17/12. On January 12, she complained of increased palpitations and dyspnea. She went to the emergency room at [facility] where she was found to have an elevated BNP around 1000 a chest x-ray showed no evidence of fluid overload. Patient appeared to be stable at that time and was discharged home to followup with [place]. Since that time, she reports no significant dyspnea or palpitations. She denies any chest pain. She does feel tired and fatigue. She did have lower extremity edema which improved with small dose Lasix.   Allergies  Allergen Reactions  . Ace Inhibitors   . Aspirin   . Eggs Or Egg-Derived Products   . Milk-Related Compounds      Current Outpatient Prescriptions on File Prior to Visit  Medication Sig Dispense Refill  . amLODipine (NORVASC) 2.5 MG tablet Take 2.5 mg by mouth daily.      Marland Kitchen omeprazole (PRILOSEC) 20 MG capsule Take 20 mg by mouth daily.       No current facility-administered medications on file prior to visit.     Past Medical  History  Diagnosis Date  . HTN (hypertension)   . HLD (hyperlipidemia)   . GERD (gastroesophageal reflux disease)   . Kidney stone   . H/O lumpectomy   . Obstructive sleep apnea   . Gout   . Diverticulosis   . Nephrolithiasis     history of right lithotripsy  . History of valvular heart disease   . Breast cancer   . CLL (chronic lymphocytic leukemia)      Past Surgical History  Procedure Laterality Date  . Total abdominal hysterectomy w/ bilateral salpingoophorectomy    . Breast lumpectomy    . Partial hip arthroplasty      right  . Cataract extraction, bilateral    . Lithotripsy      right  . Portacath placement       Family History  Problem Relation Age of Onset  . Hypertension Sister      History   Social History  . Marital Status: Widowed    Spouse Name: N/A    Number of Children: N/A  . Years of Education: N/A   Occupational History  . Not on file.   Social History Main Topics  . Smoking status: Never Smoker   . Smokeless tobacco: Not on file  . Alcohol Use: No  . Drug Use: No  . Sexually Active: Not on file   Other Topics Concern  . Not on file   Social History Narrative  . No narrative on file     ROS Constitutional: Negative for fever, chills, diaphoresis, activity change, appetite change and fatigue.  HENT: Negative for hearing loss, nosebleeds, congestion, sore throat, facial swelling, drooling, trouble swallowing, neck pain, voice change, sinus pressure and tinnitus.  Eyes: Negative for photophobia, pain, discharge and visual disturbance.  Respiratory: Negative for apnea, cough, chest tightness and wheezing.  Cardiovascular: Negative for chest pain. Gastrointestinal: Negative for nausea, vomiting, abdominal pain, diarrhea, constipation, blood in stool and abdominal distention.  Genitourinary: Negative for dysuria, urgency, frequency, hematuria and decreased urine volume.  Musculoskeletal: Negative for myalgias, back pain, joint  swelling, arthralgias and gait problem.  Skin: Negative for color change, pallor, rash and wound.  Neurological: Negative for dizziness, tremors, seizures, syncope, speech difficulty, weakness, light-headedness, numbness and headaches.  Psychiatric/Behavioral: Negative for suicidal ideas, hallucinations, behavioral problems and agitation. The patient is not nervous/anxious.     PHYSICAL EXAM   BP 102/68  Pulse 97  Ht 5\' 2"  (1.575 m)  Wt 164 lb 4 oz (74.503 kg)  BMI 30.03 kg/m2 Constitutional: She is oriented to person, place, and time. She appears well-developed and well-nourished. No distress.  HENT: No nasal discharge.  Head: Normocephalic and atraumatic.  Eyes: Pupils are equal and round. Right eye exhibits no discharge. Left eye exhibits no discharge.  Neck: Normal range of motion. Neck supple. No JVD present. No thyromegaly present.  Cardiovascular: Normal rate, regular rhythm, normal heart sounds. Exam reveals no gallop and no friction rub. No murmur heard.  Pulmonary/Chest: Effort normal and breath sounds normal. No stridor. No respiratory distress. She has no wheezes. She has no rales. She exhibits no tenderness.  Abdominal: Soft. Bowel sounds are normal. She exhibits no distension. There is no tenderness. There is no rebound and no guarding.  Musculoskeletal: Normal range of motion. She exhibits no edema and no tenderness.  Neurological: She is alert and oriented to person, place, and time. Coordination normal.  Skin: Skin is warm and dry. No rash noted. She is not diaphoretic. No erythema. No pallor.  Psychiatric: She has a normal mood and affect. Her behavior is normal. Judgment and thought content normal.     EKG: Sinus  Rhythm  -Short PR  PRi = 116 -Right atrial enlargement.   BORDERLINE   ASSESSMENT AND PLAN

## 2012-08-05 LAB — CBC CANCER CENTER
Basophil #: 0 x10 3/mm (ref 0.0–0.1)
Eosinophil #: 0.1 x10 3/mm (ref 0.0–0.7)
Eosinophil %: 1.6 %
HCT: 37.2 % (ref 35.0–47.0)
HGB: 12.1 g/dL (ref 12.0–16.0)
Lymphocyte #: 1.7 x10 3/mm (ref 1.0–3.6)
MCH: 26.3 pg (ref 26.0–34.0)
MCHC: 32.4 g/dL (ref 32.0–36.0)
MCV: 81 fL (ref 80–100)
Monocyte %: 18.5 %
Neutrophil #: 2.1 x10 3/mm (ref 1.4–6.5)
Neutrophil %: 44.5 %
RBC: 4.58 10*6/uL (ref 3.80–5.20)
RDW: 19.8 % — ABNORMAL HIGH (ref 11.5–14.5)
WBC: 4.8 x10 3/mm (ref 3.6–11.0)

## 2012-08-10 ENCOUNTER — Ambulatory Visit: Payer: Self-pay | Admitting: Internal Medicine

## 2012-08-12 LAB — CBC CANCER CENTER
Basophil #: 0 x10 3/mm (ref 0.0–0.1)
Basophil %: 0.5 %
Eosinophil %: 1.8 %
MCHC: 32.6 g/dL (ref 32.0–36.0)
MCV: 81 fL (ref 80–100)
Monocyte #: 0.7 x10 3/mm (ref 0.2–0.9)
Monocyte %: 14.7 %
Neutrophil #: 2.1 x10 3/mm (ref 1.4–6.5)
Neutrophil %: 48.1 %
Platelet: 176 x10 3/mm (ref 150–440)
RBC: 4.4 10*6/uL (ref 3.80–5.20)
RDW: 19.5 % — ABNORMAL HIGH (ref 11.5–14.5)

## 2012-08-12 LAB — BASIC METABOLIC PANEL
Anion Gap: 10 (ref 7–16)
BUN: 14 mg/dL (ref 7–18)
Co2: 26 mmol/L (ref 21–32)
EGFR (African American): 41 — ABNORMAL LOW
EGFR (Non-African Amer.): 35 — ABNORMAL LOW
Glucose: 92 mg/dL (ref 65–99)
Osmolality: 289 (ref 275–301)
Potassium: 4.2 mmol/L (ref 3.5–5.1)
Sodium: 145 mmol/L (ref 136–145)

## 2012-08-19 LAB — CBC CANCER CENTER
Basophil #: 0.1 x10 3/mm (ref 0.0–0.1)
HCT: 35 % (ref 35.0–47.0)
HGB: 11.5 g/dL — ABNORMAL LOW (ref 12.0–16.0)
Lymphocyte %: 2.2 %
MCH: 26.2 pg (ref 26.0–34.0)
MCHC: 32.9 g/dL (ref 32.0–36.0)
MCV: 80 fL (ref 80–100)
Monocyte %: 11.8 %
Neutrophil #: 19.1 x10 3/mm — ABNORMAL HIGH (ref 1.4–6.5)
Neutrophil %: 84.1 %
Platelet: 158 x10 3/mm (ref 150–440)
RDW: 20.1 % — ABNORMAL HIGH (ref 11.5–14.5)

## 2012-08-26 LAB — CBC CANCER CENTER
Basophil #: 0 x10 3/mm (ref 0.0–0.1)
Basophil %: 0.7 %
Eosinophil #: 0.2 x10 3/mm (ref 0.0–0.7)
Eosinophil %: 2.3 %
HCT: 35.8 % (ref 35.0–47.0)
HGB: 11.8 g/dL — ABNORMAL LOW (ref 12.0–16.0)
Lymphocyte %: 11.1 %
MCH: 26.3 pg (ref 26.0–34.0)
MCHC: 32.9 g/dL (ref 32.0–36.0)
MCV: 80 fL (ref 80–100)
Monocyte #: 0.8 x10 3/mm (ref 0.2–0.9)
Monocyte %: 12 %
Neutrophil #: 4.9 x10 3/mm (ref 1.4–6.5)
Neutrophil %: 73.9 %
Platelet: 171 x10 3/mm (ref 150–440)
RDW: 19.7 % — ABNORMAL HIGH (ref 11.5–14.5)
WBC: 6.6 x10 3/mm (ref 3.6–11.0)

## 2012-08-26 LAB — BASIC METABOLIC PANEL
Anion Gap: 10 (ref 7–16)
Calcium, Total: 9.7 mg/dL (ref 8.5–10.1)
Chloride: 107 mmol/L (ref 98–107)
Creatinine: 1.16 mg/dL (ref 0.60–1.30)
EGFR (African American): 50 — ABNORMAL LOW
Glucose: 77 mg/dL (ref 65–99)
Osmolality: 283 (ref 275–301)
Potassium: 3.8 mmol/L (ref 3.5–5.1)

## 2012-08-26 LAB — HEPATIC FUNCTION PANEL A (ARMC)
Alkaline Phosphatase: 142 U/L — ABNORMAL HIGH (ref 50–136)
SGOT(AST): 13 U/L — ABNORMAL LOW (ref 15–37)
SGPT (ALT): 16 U/L (ref 12–78)
Total Protein: 7.3 g/dL (ref 6.4–8.2)

## 2012-09-02 LAB — CBC CANCER CENTER
Basophil #: 0 x10 3/mm (ref 0.0–0.1)
Eosinophil %: 2.9 %
Lymphocyte #: 0.9 x10 3/mm — ABNORMAL LOW (ref 1.0–3.6)
Lymphocyte %: 19.4 %
MCH: 26.5 pg (ref 26.0–34.0)
Monocyte #: 1 x10 3/mm — ABNORMAL HIGH (ref 0.2–0.9)
Monocyte %: 20.2 %
Neutrophil #: 2.7 x10 3/mm (ref 1.4–6.5)
Neutrophil %: 56.8 %
RDW: 19.6 % — ABNORMAL HIGH (ref 11.5–14.5)
WBC: 4.8 x10 3/mm (ref 3.6–11.0)

## 2012-09-09 LAB — CBC CANCER CENTER
Basophil %: 1.1 %
Eosinophil %: 5.8 %
HCT: 32.2 % — ABNORMAL LOW (ref 35.0–47.0)
MCH: 26.4 pg (ref 26.0–34.0)
MCHC: 32.1 g/dL (ref 32.0–36.0)
MCV: 82 fL (ref 80–100)
Neutrophil #: 1.4 x10 3/mm (ref 1.4–6.5)
Neutrophil %: 50.6 %
Platelet: 157 x10 3/mm (ref 150–440)
RDW: 19.5 % — ABNORMAL HIGH (ref 11.5–14.5)
WBC: 2.9 x10 3/mm — ABNORMAL LOW (ref 3.6–11.0)

## 2012-09-09 LAB — BASIC METABOLIC PANEL
BUN: 12 mg/dL (ref 7–18)
Calcium, Total: 9.4 mg/dL (ref 8.5–10.1)
EGFR (Non-African Amer.): 40 — ABNORMAL LOW
Glucose: 94 mg/dL (ref 65–99)
Osmolality: 288 (ref 275–301)
Potassium: 5 mmol/L (ref 3.5–5.1)
Sodium: 145 mmol/L (ref 136–145)

## 2012-09-10 ENCOUNTER — Ambulatory Visit: Payer: Self-pay | Admitting: Internal Medicine

## 2012-09-16 LAB — CBC CANCER CENTER
Bands: 4 %
HCT: 35.3 % (ref 35.0–47.0)
Lymphocytes: 2 %
MCH: 25.9 pg — ABNORMAL LOW (ref 26.0–34.0)
Metamyelocyte: 2 %
Monocytes: 9 %
RBC: 4.31 10*6/uL (ref 3.80–5.20)
RDW: 19.6 % — ABNORMAL HIGH (ref 11.5–14.5)

## 2012-09-17 ENCOUNTER — Other Ambulatory Visit (INDEPENDENT_AMBULATORY_CARE_PROVIDER_SITE_OTHER): Payer: PRIVATE HEALTH INSURANCE

## 2012-09-17 ENCOUNTER — Other Ambulatory Visit: Payer: Self-pay

## 2012-09-17 DIAGNOSIS — R0600 Dyspnea, unspecified: Secondary | ICD-10-CM

## 2012-09-17 DIAGNOSIS — R000 Tachycardia, unspecified: Secondary | ICD-10-CM

## 2012-09-17 DIAGNOSIS — R0989 Other specified symptoms and signs involving the circulatory and respiratory systems: Secondary | ICD-10-CM

## 2012-09-23 LAB — CBC CANCER CENTER
Basophil #: 0 x10 3/mm (ref 0.0–0.1)
Eosinophil %: 1.4 %
Lymphocyte #: 0.2 x10 3/mm — ABNORMAL LOW (ref 1.0–3.6)
MCH: 26.3 pg (ref 26.0–34.0)
MCHC: 32.1 g/dL (ref 32.0–36.0)
MCV: 82 fL (ref 80–100)
Monocyte #: 0.4 x10 3/mm (ref 0.2–0.9)
Neutrophil #: 1.6 x10 3/mm (ref 1.4–6.5)
Neutrophil %: 71.8 %
Platelet: 105 x10 3/mm — ABNORMAL LOW (ref 150–440)
RDW: 19.7 % — ABNORMAL HIGH (ref 11.5–14.5)
WBC: 2.3 x10 3/mm — ABNORMAL LOW (ref 3.6–11.0)

## 2012-09-23 LAB — HEPATIC FUNCTION PANEL A (ARMC)
Bilirubin, Direct: 0.1 mg/dL (ref 0.00–0.20)
Bilirubin,Total: 0.4 mg/dL (ref 0.2–1.0)
SGOT(AST): 21 U/L (ref 15–37)
SGPT (ALT): 17 U/L (ref 12–78)
Total Protein: 6.6 g/dL (ref 6.4–8.2)

## 2012-09-23 LAB — CREATININE, SERUM: EGFR (African American): 50 — ABNORMAL LOW

## 2012-09-30 LAB — CBC CANCER CENTER
HCT: 32.4 % — ABNORMAL LOW (ref 35.0–47.0)
HGB: 10.5 g/dL — ABNORMAL LOW (ref 12.0–16.0)
Lymphocyte #: 0.4 x10 3/mm — ABNORMAL LOW (ref 1.0–3.6)
Lymphocyte %: 12.6 %
MCH: 26.6 pg (ref 26.0–34.0)
Monocyte %: 29 %
Neutrophil #: 1.8 x10 3/mm (ref 1.4–6.5)
Neutrophil %: 54.7 %
Platelet: 199 x10 3/mm (ref 150–440)
RBC: 3.94 10*6/uL (ref 3.80–5.20)
RDW: 19 % — ABNORMAL HIGH (ref 11.5–14.5)

## 2012-10-03 ENCOUNTER — Ambulatory Visit (INDEPENDENT_AMBULATORY_CARE_PROVIDER_SITE_OTHER): Payer: PRIVATE HEALTH INSURANCE | Admitting: Cardiovascular Disease

## 2012-10-03 ENCOUNTER — Encounter: Payer: Self-pay | Admitting: Cardiovascular Disease

## 2012-10-03 VITALS — BP 112/60 | HR 61 | Ht 62.0 in | Wt 161.2 lb

## 2012-10-03 DIAGNOSIS — I050 Rheumatic mitral stenosis: Secondary | ICD-10-CM

## 2012-10-03 DIAGNOSIS — R0602 Shortness of breath: Secondary | ICD-10-CM

## 2012-10-03 DIAGNOSIS — R002 Palpitations: Secondary | ICD-10-CM

## 2012-10-03 DIAGNOSIS — R000 Tachycardia, unspecified: Secondary | ICD-10-CM

## 2012-10-03 NOTE — Patient Instructions (Addendum)
Continue same medications  Follow up in 1 year

## 2012-10-03 NOTE — Assessment & Plan Note (Signed)
Echocardiogram showed hyperdynamic LV systolic function with mild to moderate mitral stenosis with a valve area of 1.92 and mean gradient of 7 mm mercury. I suspect that she had worsening dyspnea triggered by tachycardia which led to significant diastolic heart failure. This will be monitored with an echocardiogram every one to 2 years. I explained to her that is extremely important to make sure her blood pressure and heart rate uncontrolled. She currently has no symptoms suggestive of angina.

## 2012-10-03 NOTE — Progress Notes (Signed)
Primary care physician: Dr. Ulanda Edison  HPI  This is an 79 year old African American female who is here today for followup visit regarding dyspnea with elevated BNP and possible heart failure. 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 Sherrlyn Hock. She was also diagnosed with refractory anemia with ring sideroblasts, myelodysplastic syndrome by bone marrow biopsy on Oct 11, 2011 .  She had previous history of breast cancer more than 5 years ago, treated outside. Per the patient, she had surgery and chemotherapy and has been in remission. She was started on single-agent Rituxan on 10/25/11. In Dec 2013, she was found to have progressive disease. Started Treanda/Rituxan on 06/17/12. On January 12, she complained of increased palpitations and dyspnea. She went to the emergency room at [facility] where she was found to have an elevated BNP around 1000 a chest x-ray showed no evidence of fluid overload. She did have lower extremity edema which improved with small dose Lasix.  She had an echocardiogram done which showed hyperdynamic LV systolic function with calcified mitral valve apparatus and evidence of mild to moderate mitral stenosis. Systolic pulmonary pressure was at the upper limit of normal. She has been doing well and denies any chest pain. Her dyspnea and palpitations have improved significantly. She is on carvedilol 25 mg twice daily.   Allergies  Allergen Reactions  . Ace Inhibitors   . Aspirin   . Eggs Or Egg-Derived Products   . Milk-Related Compounds      Current Outpatient Prescriptions on File Prior to Visit  Medication Sig Dispense Refill  . acetaminophen (TYLENOL) 500 MG tablet Take 500 mg by mouth every 6 (six) hours as  needed for pain.      Marland Kitchen amLODipine (NORVASC) 2.5 MG tablet Take 2.5 mg by mouth daily.      . furosemide (LASIX) 20 MG tablet Take 20 mg by mouth as needed.      . Ipratropium-Albuterol (COMBIVENT IN) Inhale into the lungs as needed.      . latanoprost (XALATAN) 0.005 % ophthalmic solution 1 drop at bedtime.      Marland Kitchen omeprazole (PRILOSEC) 20 MG capsule Take 20 mg by mouth daily.      Marland Kitchen oxybutynin (DITROPAN) 5 MG tablet Take 5 mg by mouth 2 (two) times daily.      . simvastatin (ZOCOR) 20 MG tablet Take 20 mg by mouth daily.      . timolol (BETIMOL) 0.5 % ophthalmic solution Place 1 drop into both eyes as needed.       No current facility-administered medications on file prior to visit.     Past Medical History  Diagnosis Date  . HLD (hyperlipidemia)   . GERD (gastroesophageal reflux disease)   . Kidney stone   . H/O lumpectomy   . Obstructive sleep apnea   . Gout   . Diverticulosis   . Nephrolithiasis     history of right lithotripsy  . History of valvular heart disease   . HTN (hypertension)   . Breast cancer   . CLL (chronic lymphocytic leukemia)      Past Surgical History  Procedure Laterality Date  . Total abdominal hysterectomy w/ bilateral salpingoophorectomy    . Breast lumpectomy    . Partial hip arthroplasty      right  . Cataract extraction, bilateral    .  Lithotripsy      right  . Portacath placement       Family History  Problem Relation Age of Onset  . Hypertension Sister      History   Social History  . Marital Status: Widowed    Spouse Name: N/A    Number of Children: N/A  . Years of Education: N/A   Occupational History  . Not on file.   Social History Main Topics  . Smoking status: Never Smoker   . Smokeless tobacco: Not on file  . Alcohol Use: No  . Drug Use: No  . Sexually Active: Not on file   Other Topics Concern  . Not on file   Social History Narrative  . No narrative on file       PHYSICAL EXAM   BP 112/60  Pulse  61  Ht 5\' 2"  (1.575 m)  Wt 161 lb 4 oz (73.143 kg)  BMI 29.49 kg/m2 Constitutional: She is oriented to person, place, and time. She appears well-developed and well-nourished. No distress.  HENT: No nasal discharge.  Head: Normocephalic and atraumatic.  Eyes: Pupils are equal and round. Right eye exhibits no discharge. Left eye exhibits no discharge.  Neck: Normal range of motion. Neck supple. No JVD present. No thyromegaly present.  Cardiovascular: Normal rate, regular rhythm, normal heart sounds. Exam reveals no gallop and no friction rub. No murmur heard.  Pulmonary/Chest: Effort normal and breath sounds normal. No stridor. No respiratory distress. She has no wheezes. She has no rales. She exhibits no tenderness.  Abdominal: Soft. Bowel sounds are normal. She exhibits no distension. There is no tenderness. There is no rebound and no guarding.  Musculoskeletal: Normal range of motion. She exhibits no edema and no tenderness.  Neurological: She is alert and oriented to person, place, and time. Coordination normal.  Skin: Skin is warm and dry. No rash noted. She is not diaphoretic. No erythema. No pallor.  Psychiatric: She has a normal mood and affect. Her behavior is normal. Judgment and thought content normal.     EKG: Sinus  Rhythm  -Short PR syndrome  PRi = 112 -Prominent R(V1) -nonspecific.   BORDERLINE    ASSESSMENT AND PLAN

## 2012-10-03 NOTE — Assessment & Plan Note (Signed)
Her heart rate is better now. She is on Coreg 25 mg twice daily which will be continued. She does have a short PR interval and her EKG. If she develops any recurrent palpitations, she will require outpatient telemetry to make sure that her heart failure event was not triggered by tachyarrhythmia. She is no longer having palpitations.

## 2012-10-07 LAB — BASIC METABOLIC PANEL WITH GFR
Anion Gap: 10 (ref 7–16)
BUN: 11 mg/dL (ref 7–18)
Calcium, Total: 9.5 mg/dL (ref 8.5–10.1)
Chloride: 109 mmol/L — ABNORMAL HIGH (ref 98–107)
Co2: 26 mmol/L (ref 21–32)
Creatinine: 1.14 mg/dL (ref 0.60–1.30)
EGFR (African American): 52 — ABNORMAL LOW
EGFR (Non-African Amer.): 44 — ABNORMAL LOW
Glucose: 87 mg/dL (ref 65–99)
Osmolality: 287 (ref 275–301)
Potassium: 3.6 mmol/L (ref 3.5–5.1)
Sodium: 145 mmol/L (ref 136–145)

## 2012-10-07 LAB — CBC CANCER CENTER
Eosinophil #: 0.1 x10 3/mm (ref 0.0–0.7)
Eosinophil %: 5.1 %
HCT: 29.5 % — ABNORMAL LOW (ref 35.0–47.0)
Lymphocyte #: 0.4 x10 3/mm — ABNORMAL LOW (ref 1.0–3.6)
Lymphocyte %: 13.8 %
MCH: 27 pg (ref 26.0–34.0)
Neutrophil #: 1.3 x10 3/mm — ABNORMAL LOW (ref 1.4–6.5)
Neutrophil %: 49.9 %
RBC: 3.61 10*6/uL — ABNORMAL LOW (ref 3.80–5.20)

## 2012-10-10 ENCOUNTER — Ambulatory Visit: Payer: Self-pay | Admitting: Internal Medicine

## 2012-10-14 LAB — CBC CANCER CENTER
Basophil %: 0.4 %
HGB: 9.4 g/dL — ABNORMAL LOW (ref 12.0–16.0)
Lymphocyte %: 11.3 %
MCH: 26.1 pg (ref 26.0–34.0)
Monocyte #: 0.8 x10 3/mm (ref 0.2–0.9)
Neutrophil %: 59.8 %
RBC: 3.58 10*6/uL — ABNORMAL LOW (ref 3.80–5.20)
WBC: 3.4 x10 3/mm — ABNORMAL LOW (ref 3.6–11.0)

## 2012-10-17 ENCOUNTER — Ambulatory Visit: Payer: Self-pay | Admitting: Urology

## 2012-10-21 LAB — CBC CANCER CENTER
Eosinophil: 3 %
HCT: 32.6 % — ABNORMAL LOW (ref 35.0–47.0)
HGB: 10.8 g/dL — ABNORMAL LOW (ref 12.0–16.0)
MCHC: 33 g/dL (ref 32.0–36.0)
MCV: 82 fL (ref 80–100)
Neutrophil %: 80.4 %
RBC: 3.98 10*6/uL (ref 3.80–5.20)
Segmented Neutrophils: 76 %
WBC: 15.9 x10 3/mm — ABNORMAL HIGH (ref 3.6–11.0)

## 2012-10-28 LAB — CREATININE, SERUM
EGFR (African American): 52 — ABNORMAL LOW
EGFR (Non-African Amer.): 44 — ABNORMAL LOW

## 2012-10-28 LAB — CBC CANCER CENTER
Basophil #: 0.1 x10 3/mm (ref 0.0–0.1)
Basophil %: 1 %
Eosinophil #: 0.2 x10 3/mm (ref 0.0–0.7)
Eosinophil %: 2.7 %
HGB: 9.9 g/dL — ABNORMAL LOW (ref 12.0–16.0)
Lymphocyte #: 0.4 x10 3/mm — ABNORMAL LOW (ref 1.0–3.6)
MCH: 26.9 pg (ref 26.0–34.0)
MCV: 82 fL (ref 80–100)
Monocyte %: 13.8 %
Neutrophil %: 75.8 %
Platelet: 195 x10 3/mm (ref 150–440)
RDW: 18.2 % — ABNORMAL HIGH (ref 11.5–14.5)

## 2012-10-28 LAB — HEPATIC FUNCTION PANEL A (ARMC)
Albumin: 3.1 g/dL — ABNORMAL LOW (ref 3.4–5.0)
Alkaline Phosphatase: 112 U/L (ref 50–136)
SGPT (ALT): 12 U/L (ref 12–78)
Total Protein: 7 g/dL (ref 6.4–8.2)

## 2012-11-05 LAB — CBC CANCER CENTER
Basophil #: 0 x10 3/mm (ref 0.0–0.1)
Eosinophil %: 5.6 %
HCT: 29.1 % — ABNORMAL LOW (ref 35.0–47.0)
HGB: 9.7 g/dL — ABNORMAL LOW (ref 12.0–16.0)
Lymphocyte %: 17.3 %
MCV: 81 fL (ref 80–100)
Monocyte #: 0.8 x10 3/mm (ref 0.2–0.9)
Neutrophil %: 53.1 %
Platelet: 206 x10 3/mm (ref 150–440)

## 2012-11-10 ENCOUNTER — Ambulatory Visit: Payer: Self-pay | Admitting: Internal Medicine

## 2012-11-11 LAB — CBC CANCER CENTER
Basophil %: 0.5 %
Eosinophil %: 9 %
HCT: 30.3 % — ABNORMAL LOW (ref 35.0–47.0)
Lymphocyte #: 0.6 x10 3/mm — ABNORMAL LOW (ref 1.0–3.6)
Lymphocyte %: 23.7 %
MCV: 82 fL (ref 80–100)
Monocyte #: 0.6 x10 3/mm (ref 0.2–0.9)
Neutrophil #: 1 x10 3/mm — ABNORMAL LOW (ref 1.4–6.5)
Platelet: 170 x10 3/mm (ref 150–440)
RBC: 3.71 10*6/uL — ABNORMAL LOW (ref 3.80–5.20)
RDW: 17.9 % — ABNORMAL HIGH (ref 11.5–14.5)
WBC: 2.5 x10 3/mm — ABNORMAL LOW (ref 3.6–11.0)

## 2012-11-11 LAB — BASIC METABOLIC PANEL
Anion Gap: 10 (ref 7–16)
BUN: 11 mg/dL (ref 7–18)
Calcium, Total: 9.5 mg/dL (ref 8.5–10.1)
Chloride: 108 mmol/L — ABNORMAL HIGH (ref 98–107)
Co2: 25 mmol/L (ref 21–32)
Creatinine: 1.13 mg/dL (ref 0.60–1.30)
EGFR (African American): 52 — ABNORMAL LOW
EGFR (Non-African Amer.): 45 — ABNORMAL LOW
Glucose: 86 mg/dL (ref 65–99)
Osmolality: 284 (ref 275–301)

## 2012-12-10 ENCOUNTER — Ambulatory Visit: Payer: Self-pay | Admitting: Internal Medicine

## 2012-12-23 LAB — CBC CANCER CENTER
Basophil #: 0 x10 3/mm (ref 0.0–0.1)
Eosinophil #: 0.2 x10 3/mm (ref 0.0–0.7)
Eosinophil %: 8.1 %
HCT: 30.1 % — ABNORMAL LOW (ref 35.0–47.0)
HGB: 10.2 g/dL — ABNORMAL LOW (ref 12.0–16.0)
Lymphocyte %: 21.7 %
MCH: 27.9 pg (ref 26.0–34.0)
MCHC: 34 g/dL (ref 32.0–36.0)
MCV: 82 fL (ref 80–100)
Monocyte #: 0.6 x10 3/mm (ref 0.2–0.9)
Monocyte %: 25.5 %
Neutrophil #: 1.1 x10 3/mm — ABNORMAL LOW (ref 1.4–6.5)
Platelet: 166 x10 3/mm (ref 150–440)
RBC: 3.66 10*6/uL — ABNORMAL LOW (ref 3.80–5.20)

## 2013-01-10 ENCOUNTER — Ambulatory Visit: Payer: Self-pay | Admitting: Internal Medicine

## 2013-02-03 LAB — CBC CANCER CENTER
Basophil #: 0 x10 3/mm (ref 0.0–0.1)
Basophil %: 1.3 %
Eosinophil %: 6.6 %
HGB: 12 g/dL (ref 12.0–16.0)
Lymphocyte %: 20.7 %
MCHC: 33.3 g/dL (ref 32.0–36.0)
MCV: 82 fL (ref 80–100)
Monocyte #: 0.5 x10 3/mm (ref 0.2–0.9)
Monocyte %: 16.1 %
Neutrophil #: 1.7 x10 3/mm (ref 1.4–6.5)
Neutrophil %: 55.3 %
RBC: 4.39 10*6/uL (ref 3.80–5.20)

## 2013-02-03 LAB — BASIC METABOLIC PANEL
Anion Gap: 4 — ABNORMAL LOW (ref 7–16)
BUN: 17 mg/dL (ref 7–18)
Co2: 26 mmol/L (ref 21–32)
Creatinine: 1.15 mg/dL (ref 0.60–1.30)
EGFR (African American): 51 — ABNORMAL LOW
Osmolality: 280 (ref 275–301)
Potassium: 4.8 mmol/L (ref 3.5–5.1)
Sodium: 140 mmol/L (ref 136–145)

## 2013-02-03 LAB — URINALYSIS, COMPLETE
Bilirubin,UR: NEGATIVE
Blood: NEGATIVE
Glucose,UR: NEGATIVE mg/dL (ref 0–75)
Leukocyte Esterase: NEGATIVE
Nitrite: NEGATIVE
Protein: NEGATIVE
RBC,UR: <1 /HPF (ref 0–5)
Squamous Epithelial: NONE SEEN

## 2013-02-03 LAB — HEPATIC FUNCTION PANEL A (ARMC)
Albumin: 3.7 g/dL (ref 3.4–5.0)
Bilirubin, Direct: <0.1 mg/dL (ref 0.00–0.20)
SGOT(AST): 18 U/L (ref 15–37)

## 2013-02-04 LAB — URINE CULTURE

## 2013-02-10 ENCOUNTER — Ambulatory Visit: Payer: Self-pay | Admitting: Internal Medicine

## 2013-04-23 ENCOUNTER — Ambulatory Visit: Payer: Self-pay | Admitting: Orthopedic Surgery

## 2013-05-12 ENCOUNTER — Ambulatory Visit: Payer: Self-pay | Admitting: Orthopedic Surgery

## 2013-05-12 LAB — URINALYSIS, COMPLETE
Glucose,UR: NEGATIVE mg/dL (ref 0–75)
Leukocyte Esterase: NEGATIVE
Nitrite: NEGATIVE
Ph: 5 (ref 4.5–8.0)
Protein: NEGATIVE
RBC,UR: 2 /HPF (ref 0–5)
Specific Gravity: 1.023 (ref 1.003–1.030)
WBC UR: 4 /HPF (ref 0–5)

## 2013-05-12 LAB — CBC
HCT: 32.4 % — ABNORMAL LOW (ref 35.0–47.0)
HGB: 11 g/dL — ABNORMAL LOW (ref 12.0–16.0)
MCHC: 33.8 g/dL (ref 32.0–36.0)
Platelet: 173 10*3/uL (ref 150–440)
RBC: 3.93 10*6/uL (ref 3.80–5.20)
RDW: 16.2 % — ABNORMAL HIGH (ref 11.5–14.5)

## 2013-05-12 LAB — BASIC METABOLIC PANEL
Anion Gap: 0 — ABNORMAL LOW (ref 7–16)
Creatinine: 1.18 mg/dL (ref 0.60–1.30)
EGFR (African American): 49 — ABNORMAL LOW
EGFR (Non-African Amer.): 42 — ABNORMAL LOW
Potassium: 4.1 mmol/L (ref 3.5–5.1)

## 2013-05-12 LAB — APTT: Activated PTT: 34.8 secs (ref 23.6–35.9)

## 2013-05-12 LAB — SEDIMENTATION RATE: Erythrocyte Sed Rate: 38 mm/hr — ABNORMAL HIGH (ref 0–30)

## 2013-05-12 LAB — PROTIME-INR: INR: 1.2

## 2013-05-13 LAB — MRSA PCR SCREENING

## 2013-05-14 LAB — URINE CULTURE

## 2013-05-22 ENCOUNTER — Inpatient Hospital Stay: Payer: Self-pay | Admitting: Orthopedic Surgery

## 2013-05-23 LAB — BASIC METABOLIC PANEL
Anion Gap: 6 — ABNORMAL LOW (ref 7–16)
BUN: 13 mg/dL (ref 7–18)
Calcium, Total: 9.4 mg/dL (ref 8.5–10.1)
Co2: 25 mmol/L (ref 21–32)
EGFR (African American): 54 — ABNORMAL LOW
EGFR (Non-African Amer.): 47 — ABNORMAL LOW
Glucose: 112 mg/dL — ABNORMAL HIGH (ref 65–99)
Potassium: 4.2 mmol/L (ref 3.5–5.1)

## 2013-05-23 LAB — PLATELET COUNT: Platelet: 158 10*3/uL (ref 150–440)

## 2013-05-23 LAB — HEMOGLOBIN: HGB: 9 g/dL — ABNORMAL LOW (ref 12.0–16.0)

## 2013-05-24 LAB — HEMOGLOBIN: HGB: 7.9 g/dL — ABNORMAL LOW

## 2013-05-24 LAB — CREATININE, SERUM
Creatinine: 1.19 mg/dL
EGFR (African American): 49 — ABNORMAL LOW
EGFR (Non-African Amer.): 42 — ABNORMAL LOW

## 2013-05-25 LAB — HEMOGLOBIN: HGB: 9 g/dL — ABNORMAL LOW (ref 12.0–16.0)

## 2013-05-26 ENCOUNTER — Encounter: Payer: Self-pay | Admitting: Internal Medicine

## 2013-05-29 LAB — CBC WITH DIFFERENTIAL/PLATELET
Basophil %: 0.1 %
HCT: 31.4 % — ABNORMAL LOW (ref 35.0–47.0)
HGB: 10.3 g/dL — ABNORMAL LOW (ref 12.0–16.0)
MCH: 27.6 pg (ref 26.0–34.0)
MCHC: 32.9 g/dL (ref 32.0–36.0)
MCV: 84 fL (ref 80–100)
Monocyte #: 0.7 x10 3/mm (ref 0.2–0.9)
Monocyte %: 6 %
Neutrophil #: 10.3 10*3/uL — ABNORMAL HIGH (ref 1.4–6.5)
Neutrophil %: 90 %
Platelet: 287 10*3/uL (ref 150–440)
RBC: 3.74 10*6/uL — ABNORMAL LOW (ref 3.80–5.20)
RDW: 16.2 % — ABNORMAL HIGH (ref 11.5–14.5)
WBC: 11.4 10*3/uL — ABNORMAL HIGH (ref 3.6–11.0)

## 2013-05-29 LAB — COMPREHENSIVE METABOLIC PANEL
Albumin: 2.5 g/dL — ABNORMAL LOW (ref 3.4–5.0)
Co2: 23 mmol/L (ref 21–32)
Creatinine: 1.01 mg/dL (ref 0.60–1.30)
EGFR (African American): 59 — ABNORMAL LOW
EGFR (Non-African Amer.): 51 — ABNORMAL LOW
Glucose: 110 mg/dL — ABNORMAL HIGH (ref 65–99)
Potassium: 4.1 mmol/L (ref 3.5–5.1)
SGOT(AST): 64 U/L — ABNORMAL HIGH (ref 15–37)
SGPT (ALT): 66 U/L (ref 12–78)
Total Protein: 7.5 g/dL (ref 6.4–8.2)

## 2013-05-29 LAB — URINALYSIS, COMPLETE
Ph: 5 (ref 4.5–8.0)
Protein: 30
Specific Gravity: 1.024 (ref 1.003–1.030)
Squamous Epithelial: <1

## 2013-05-30 ENCOUNTER — Ambulatory Visit: Payer: Self-pay | Admitting: Internal Medicine

## 2013-05-31 ENCOUNTER — Inpatient Hospital Stay: Payer: Self-pay | Admitting: Surgery

## 2013-05-31 LAB — COMPREHENSIVE METABOLIC PANEL
Albumin: 2.5 g/dL — ABNORMAL LOW (ref 3.4–5.0)
Anion Gap: 9 (ref 7–16)
BUN: 36 mg/dL — ABNORMAL HIGH (ref 7–18)
Bilirubin,Total: 0.8 mg/dL (ref 0.2–1.0)
Calcium, Total: 9.8 mg/dL (ref 8.5–10.1)
Chloride: 106 mmol/L (ref 98–107)
Co2: 23 mmol/L (ref 21–32)
EGFR (Non-African Amer.): 38 — ABNORMAL LOW
Glucose: 126 mg/dL — ABNORMAL HIGH (ref 65–99)
Osmolality: 286 (ref 275–301)
SGPT (ALT): 51 U/L (ref 12–78)

## 2013-05-31 LAB — CBC
MCH: 25.7 pg — ABNORMAL LOW (ref 26.0–34.0)
MCHC: 31 g/dL — ABNORMAL LOW (ref 32.0–36.0)
MCV: 83 fL (ref 80–100)
Platelet: 384 10*3/uL (ref 150–440)
RBC: 3.89 10*6/uL (ref 3.80–5.20)
WBC: 15.8 10*3/uL — ABNORMAL HIGH (ref 3.6–11.0)

## 2013-05-31 LAB — URINALYSIS, COMPLETE
Bilirubin,UR: NEGATIVE
Blood: NEGATIVE
Glucose,UR: NEGATIVE mg/dL (ref 0–75)
Nitrite: NEGATIVE
Protein: 30
RBC,UR: 18 /HPF (ref 0–5)
Squamous Epithelial: NONE SEEN

## 2013-06-01 LAB — CBC WITH DIFFERENTIAL/PLATELET
Basophil #: 0 10*3/uL (ref 0.0–0.1)
Basophil %: 0 %
Eosinophil #: 0 10*3/uL (ref 0.0–0.7)
HCT: 28.9 % — ABNORMAL LOW (ref 35.0–47.0)
Lymphocyte %: 3.4 %
MCH: 26.2 pg (ref 26.0–34.0)
MCHC: 31.5 g/dL — ABNORMAL LOW (ref 32.0–36.0)
Neutrophil #: 9.2 10*3/uL — ABNORMAL HIGH (ref 1.4–6.5)
Neutrophil %: 90.6 %
Platelet: 320 10*3/uL (ref 150–440)
RBC: 3.48 10*6/uL — ABNORMAL LOW (ref 3.80–5.20)
WBC: 10.1 10*3/uL (ref 3.6–11.0)

## 2013-06-01 LAB — BASIC METABOLIC PANEL
BUN: 28 mg/dL — ABNORMAL HIGH (ref 7–18)
Calcium, Total: 9.6 mg/dL (ref 8.5–10.1)
Chloride: 107 mmol/L (ref 98–107)
Co2: 26 mmol/L (ref 21–32)
Creatinine: 1.36 mg/dL — ABNORMAL HIGH (ref 0.60–1.30)
EGFR (African American): 41 — ABNORMAL LOW
Glucose: 135 mg/dL — ABNORMAL HIGH (ref 65–99)
Osmolality: 287 (ref 275–301)

## 2013-06-01 LAB — URINE CULTURE

## 2013-06-01 LAB — MAGNESIUM: Magnesium: 2 mg/dL

## 2013-06-02 LAB — CBC WITH DIFFERENTIAL/PLATELET
Basophil #: 0 10*3/uL (ref 0.0–0.1)
Basophil %: 0 %
Eosinophil #: 0 10*3/uL (ref 0.0–0.7)
Eosinophil %: 0.3 %
HCT: 30.4 % — ABNORMAL LOW (ref 35.0–47.0)
Lymphocyte %: 3.2 %
Monocyte #: 0.4 x10 3/mm (ref 0.2–0.9)
Neutrophil #: 10.8 10*3/uL — ABNORMAL HIGH (ref 1.4–6.5)
RBC: 3.62 10*6/uL — ABNORMAL LOW (ref 3.80–5.20)
RDW: 16.3 % — ABNORMAL HIGH (ref 11.5–14.5)
WBC: 11.7 10*3/uL — ABNORMAL HIGH (ref 3.6–11.0)

## 2013-06-02 LAB — BASIC METABOLIC PANEL
BUN: 17 mg/dL (ref 7–18)
Calcium, Total: 9.1 mg/dL (ref 8.5–10.1)
Chloride: 110 mmol/L — ABNORMAL HIGH (ref 98–107)
Co2: 23 mmol/L (ref 21–32)
Creatinine: 1.03 mg/dL (ref 0.60–1.30)
EGFR (Non-African Amer.): 50 — ABNORMAL LOW
Glucose: 125 mg/dL — ABNORMAL HIGH (ref 65–99)
Osmolality: 281 (ref 275–301)
Potassium: 4.7 mmol/L (ref 3.5–5.1)

## 2013-06-03 LAB — BASIC METABOLIC PANEL
Anion Gap: 5 — ABNORMAL LOW (ref 7–16)
BUN: 9 mg/dL (ref 7–18)
EGFR (Non-African Amer.): 58 — ABNORMAL LOW
Glucose: 128 mg/dL — ABNORMAL HIGH (ref 65–99)
Potassium: 4.4 mmol/L (ref 3.5–5.1)
Sodium: 137 mmol/L (ref 136–145)

## 2013-06-03 LAB — CBC WITH DIFFERENTIAL/PLATELET
Basophil #: 0 10*3/uL (ref 0.0–0.1)
Eosinophil #: 0 10*3/uL (ref 0.0–0.7)
HCT: 28.5 % — ABNORMAL LOW (ref 35.0–47.0)
HGB: 9.4 g/dL — ABNORMAL LOW (ref 12.0–16.0)
MCHC: 33 g/dL (ref 32.0–36.0)
MCV: 84 fL (ref 80–100)
Monocyte #: 0.6 x10 3/mm (ref 0.2–0.9)
Monocyte %: 4.7 %
Neutrophil #: 12.4 10*3/uL — ABNORMAL HIGH (ref 1.4–6.5)
Neutrophil %: 91.5 %
RBC: 3.4 10*6/uL — ABNORMAL LOW (ref 3.80–5.20)
RDW: 15.9 % — ABNORMAL HIGH (ref 11.5–14.5)
WBC: 13.6 10*3/uL — ABNORMAL HIGH (ref 3.6–11.0)

## 2013-06-05 LAB — CBC WITH DIFFERENTIAL/PLATELET
Basophil #: 0 10*3/uL (ref 0.0–0.1)
MCV: 83 fL (ref 80–100)
Monocyte #: 0.7 x10 3/mm (ref 0.2–0.9)
Neutrophil #: 5.8 10*3/uL (ref 1.4–6.5)
Neutrophil %: 82.9 %
RDW: 15.6 % — ABNORMAL HIGH (ref 11.5–14.5)
WBC: 7.1 10*3/uL (ref 3.6–11.0)

## 2013-06-05 LAB — BASIC METABOLIC PANEL
BUN: 7 mg/dL (ref 7–18)
Calcium, Total: 9.3 mg/dL (ref 8.5–10.1)
Chloride: 110 mmol/L — ABNORMAL HIGH (ref 98–107)
EGFR (Non-African Amer.): >60
Osmolality: 276 (ref 275–301)
Sodium: 139 mmol/L (ref 136–145)

## 2013-06-06 LAB — BASIC METABOLIC PANEL
Anion Gap: 3 — ABNORMAL LOW (ref 7–16)
BUN: 6 mg/dL — ABNORMAL LOW (ref 7–18)
Chloride: 111 mmol/L — ABNORMAL HIGH (ref 98–107)
Co2: 28 mmol/L (ref 21–32)
EGFR (African American): >60
EGFR (Non-African Amer.): 52 — ABNORMAL LOW
Sodium: 142 mmol/L (ref 136–145)

## 2013-06-08 LAB — BASIC METABOLIC PANEL
Anion Gap: 6 — ABNORMAL LOW (ref 7–16)
Co2: 25 mmol/L (ref 21–32)
Creatinine: 1.01 mg/dL (ref 0.60–1.30)
EGFR (Non-African Amer.): 51 — ABNORMAL LOW
Glucose: 90 mg/dL (ref 65–99)
Osmolality: 278 (ref 275–301)

## 2013-06-08 LAB — CBC WITH DIFFERENTIAL/PLATELET
Basophil #: 0 10*3/uL (ref 0.0–0.1)
Basophil %: 0.3 %
Eosinophil %: 1.5 %
HCT: 25.5 % — ABNORMAL LOW (ref 35.0–47.0)
HGB: 8.5 g/dL — ABNORMAL LOW (ref 12.0–16.0)
Lymphocyte #: 0.5 10*3/uL — ABNORMAL LOW (ref 1.0–3.6)
Lymphocyte %: 9.9 %
MCH: 27.8 pg (ref 26.0–34.0)
MCHC: 33.4 g/dL (ref 32.0–36.0)
Monocyte %: 14 %
Neutrophil %: 74.3 %
Platelet: 242 10*3/uL (ref 150–440)
RDW: 15.7 % — ABNORMAL HIGH (ref 11.5–14.5)
WBC: 4.6 10*3/uL (ref 3.6–11.0)

## 2013-06-12 ENCOUNTER — Encounter: Payer: Self-pay | Admitting: Internal Medicine

## 2013-07-21 ENCOUNTER — Ambulatory Visit: Payer: Self-pay | Admitting: Internal Medicine

## 2013-07-22 LAB — CBC CANCER CENTER
BASOS ABS: 0 x10 3/mm (ref 0.0–0.1)
Basophil %: 0.5 %
Eosinophil #: 0.1 x10 3/mm (ref 0.0–0.7)
Eosinophil %: 2.3 %
HCT: 32.5 % — AB (ref 35.0–47.0)
HGB: 10.3 g/dL — AB (ref 12.0–16.0)
Lymphocyte #: 1.3 x10 3/mm (ref 1.0–3.6)
Lymphocyte %: 26.1 %
MCH: 27 pg (ref 26.0–34.0)
MCHC: 31.7 g/dL — ABNORMAL LOW (ref 32.0–36.0)
MCV: 85 fL (ref 80–100)
MONOS PCT: 9.7 %
Monocyte #: 0.5 x10 3/mm (ref 0.2–0.9)
Neutrophil #: 3 x10 3/mm (ref 1.4–6.5)
Neutrophil %: 61.4 %
Platelet: 215 x10 3/mm (ref 150–440)
RBC: 3.8 10*6/uL (ref 3.80–5.20)
RDW: 17.9 % — AB (ref 11.5–14.5)
WBC: 4.8 x10 3/mm (ref 3.6–11.0)

## 2013-07-22 LAB — HEPATIC FUNCTION PANEL A (ARMC)
ALBUMIN: 3.2 g/dL — AB (ref 3.4–5.0)
ALK PHOS: 82 U/L
BILIRUBIN DIRECT: 0.1 mg/dL (ref 0.00–0.20)
Bilirubin,Total: 0.3 mg/dL (ref 0.2–1.0)
SGOT(AST): 12 U/L — ABNORMAL LOW (ref 15–37)
SGPT (ALT): 7 U/L — ABNORMAL LOW (ref 12–78)
Total Protein: 7.1 g/dL (ref 6.4–8.2)

## 2013-07-22 LAB — CREATININE, SERUM
Creatinine: 1.15 mg/dL (ref 0.60–1.30)
EGFR (African American): 51 — ABNORMAL LOW
EGFR (Non-African Amer.): 44 — ABNORMAL LOW

## 2013-07-22 LAB — LACTATE DEHYDROGENASE: LDH: 140 U/L (ref 81–246)

## 2013-07-22 LAB — CALCIUM: Calcium, Total: 8.9 mg/dL (ref 8.5–10.1)

## 2013-08-10 ENCOUNTER — Ambulatory Visit: Payer: Self-pay | Admitting: Internal Medicine

## 2013-10-14 ENCOUNTER — Ambulatory Visit: Payer: Self-pay | Admitting: Internal Medicine

## 2013-11-10 ENCOUNTER — Ambulatory Visit: Payer: Self-pay | Admitting: Urology

## 2013-11-11 ENCOUNTER — Ambulatory Visit: Payer: Self-pay | Admitting: Internal Medicine

## 2013-12-02 LAB — HEPATIC FUNCTION PANEL A (ARMC)
AST: 14 U/L — AB (ref 15–37)
Albumin: 3.5 g/dL (ref 3.4–5.0)
Alkaline Phosphatase: 68 U/L
BILIRUBIN TOTAL: 0.3 mg/dL (ref 0.2–1.0)
Bilirubin, Direct: <0.1 mg/dL (ref 0.00–0.20)
SGPT (ALT): 14 U/L (ref 12–78)
Total Protein: 7.4 g/dL (ref 6.4–8.2)

## 2013-12-02 LAB — CBC CANCER CENTER
BASOS PCT: 0.9 %
Basophil #: 0 x10 3/mm (ref 0.0–0.1)
EOS PCT: 2.6 %
Eosinophil #: 0.1 x10 3/mm (ref 0.0–0.7)
HCT: 32.9 % — ABNORMAL LOW (ref 35.0–47.0)
HGB: 10.7 g/dL — ABNORMAL LOW (ref 12.0–16.0)
LYMPHS PCT: 25.5 %
Lymphocyte #: 1.3 x10 3/mm (ref 1.0–3.6)
MCH: 27.2 pg (ref 26.0–34.0)
MCHC: 32.4 g/dL (ref 32.0–36.0)
MCV: 84 fL (ref 80–100)
Monocyte #: 0.6 x10 3/mm (ref 0.2–0.9)
Monocyte %: 11.9 %
NEUTROS ABS: 3.1 x10 3/mm (ref 1.4–6.5)
Neutrophil %: 59.1 %
Platelet: 172 x10 3/mm (ref 150–440)
RBC: 3.93 10*6/uL (ref 3.80–5.20)
RDW: 16.1 % — ABNORMAL HIGH (ref 11.5–14.5)
WBC: 5.3 x10 3/mm (ref 3.6–11.0)

## 2013-12-02 LAB — CREATININE, SERUM
CREATININE: 1.5 mg/dL — AB (ref 0.60–1.30)
EGFR (Non-African Amer.): 32 — ABNORMAL LOW
GFR CALC AF AMER: 37 — AB

## 2013-12-02 LAB — LACTATE DEHYDROGENASE: LDH: 124 U/L (ref 81–246)

## 2013-12-02 LAB — CALCIUM: CALCIUM: 10 mg/dL (ref 8.5–10.1)

## 2013-12-10 ENCOUNTER — Ambulatory Visit: Payer: Self-pay | Admitting: Internal Medicine

## 2014-01-13 ENCOUNTER — Ambulatory Visit: Payer: Self-pay | Admitting: Internal Medicine

## 2014-02-10 ENCOUNTER — Ambulatory Visit: Payer: Self-pay | Admitting: Internal Medicine

## 2014-03-12 ENCOUNTER — Ambulatory Visit: Payer: Self-pay | Admitting: Internal Medicine

## 2014-04-12 ENCOUNTER — Ambulatory Visit: Payer: Self-pay | Admitting: Internal Medicine

## 2014-05-19 ENCOUNTER — Ambulatory Visit: Payer: Self-pay | Admitting: Internal Medicine

## 2014-05-19 LAB — CBC CANCER CENTER
Basophil #: 0 x10 3/mm (ref 0.0–0.1)
Basophil %: 0.5 %
EOS ABS: 0.2 x10 3/mm (ref 0.0–0.7)
Eosinophil %: 3.2 %
HCT: 33.5 % — ABNORMAL LOW (ref 35.0–47.0)
HGB: 10.8 g/dL — ABNORMAL LOW (ref 12.0–16.0)
Lymphocyte #: 0.9 x10 3/mm — ABNORMAL LOW (ref 1.0–3.6)
Lymphocyte %: 19.6 %
MCH: 27.7 pg (ref 26.0–34.0)
MCHC: 32.3 g/dL (ref 32.0–36.0)
MCV: 86 fL (ref 80–100)
MONO ABS: 0.5 x10 3/mm (ref 0.2–0.9)
MONOS PCT: 10.3 %
Neutrophil #: 3.2 x10 3/mm (ref 1.4–6.5)
Neutrophil %: 66.4 %
Platelet: 206 x10 3/mm (ref 150–440)
RBC: 3.91 10*6/uL (ref 3.80–5.20)
RDW: 16 % — ABNORMAL HIGH (ref 11.5–14.5)
WBC: 4.8 x10 3/mm (ref 3.6–11.0)

## 2014-05-19 LAB — HEPATIC FUNCTION PANEL A (ARMC)
ALK PHOS: 78 U/L
Albumin: 3.5 g/dL (ref 3.4–5.0)
BILIRUBIN TOTAL: 0.3 mg/dL (ref 0.2–1.0)
SGOT(AST): 12 U/L — ABNORMAL LOW (ref 15–37)
SGPT (ALT): 15 U/L
Total Protein: 7.5 g/dL (ref 6.4–8.2)

## 2014-05-19 LAB — LACTATE DEHYDROGENASE: LDH: 144 U/L (ref 81–246)

## 2014-05-19 LAB — CREATININE, SERUM
Creatine, Serum: 1.36
Creatinine: 1.36 mg/dL — ABNORMAL HIGH (ref 0.60–1.30)
EGFR (Non-African Amer.): 39 — ABNORMAL LOW
GFR CALC AF AMER: 48 — AB

## 2014-06-12 ENCOUNTER — Ambulatory Visit: Payer: Self-pay | Admitting: Internal Medicine

## 2014-06-13 IMAGING — CT CT ABD-PELV W/ CM
2 of 5 series · 16 of 46 positions shown, 18 images · IV contrast (isovue)
Comparison: 02/03/2013

CLINICAL DATA: Abdominal pain, bowel obstruction, recent hip
surgery on [DATE]

EXAM:
CT ABDOMEN AND PELVIS WITH CONTRAST
TECHNIQUE: Multidetector CT imaging of the abdomen and pelvis was performed
using the standard protocol following bolus administration of
intravenous contrast.
CONTRAST:  125 mL Isovue 370 IV

[Series 2: routine abd pel with · axial · 0.75mm/px · z∈[-976,-592]mm · 13 of 89 slices shown, 15 images]
[im 6/89  soft-tissue]
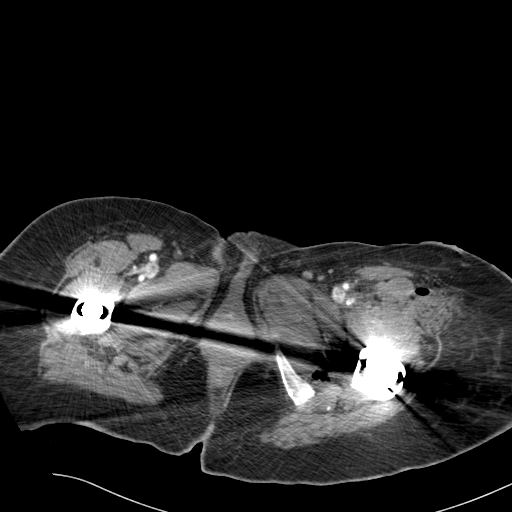
[im 6/89  bone]
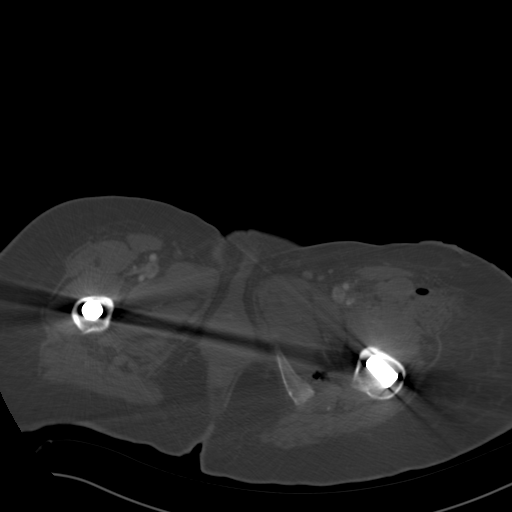
[im 11/89  soft-tissue]
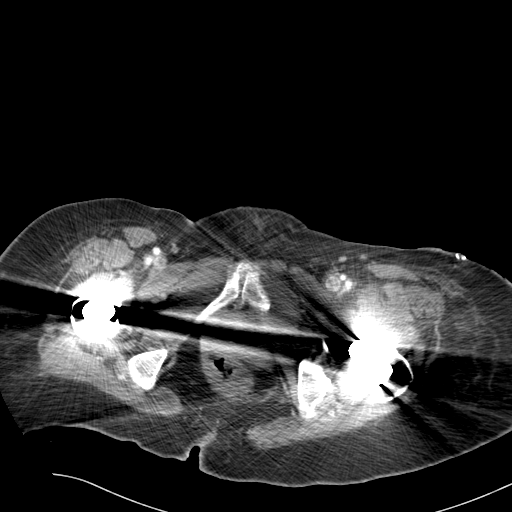
[im 21/89  soft-tissue]
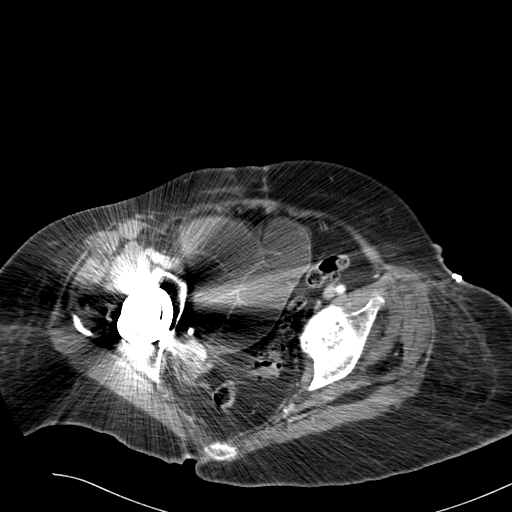
[im 26/89  soft-tissue]
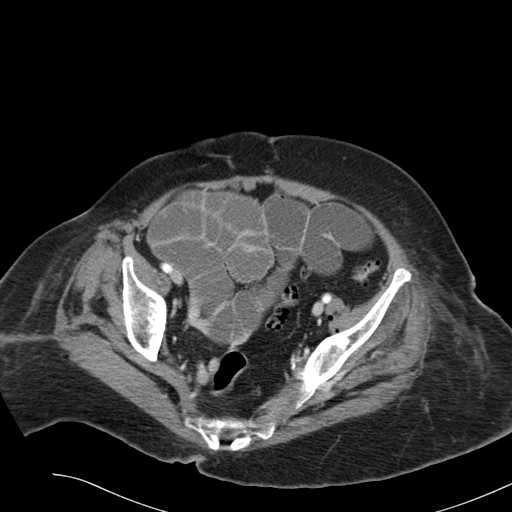
[im 32/89  soft-tissue]
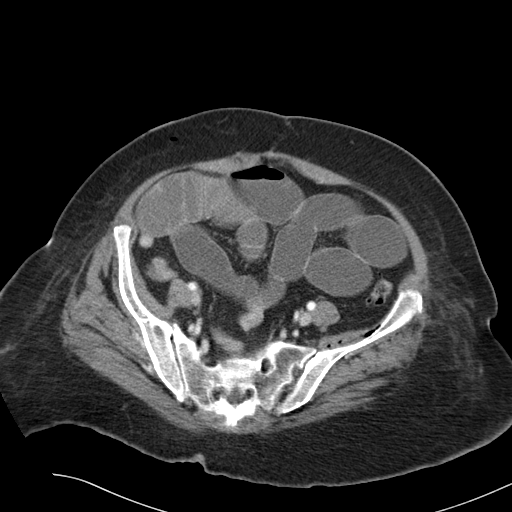
[im 37/89  soft-tissue]
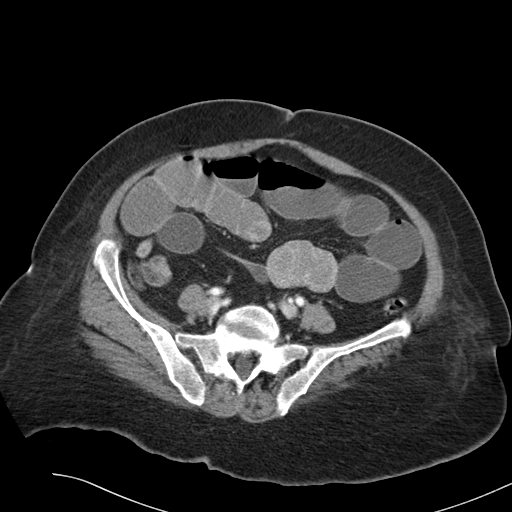
[im 47/89  soft-tissue]
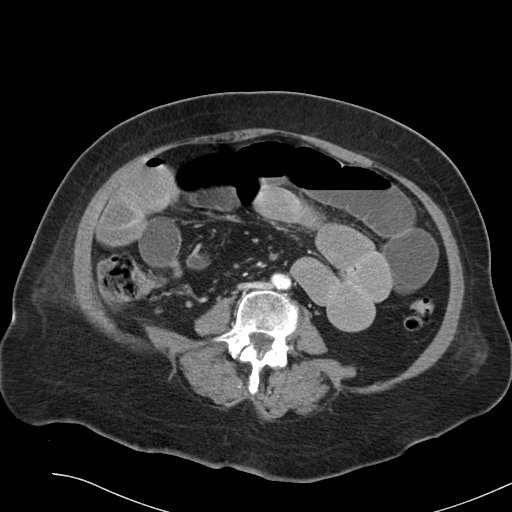
[im 52/89  soft-tissue]
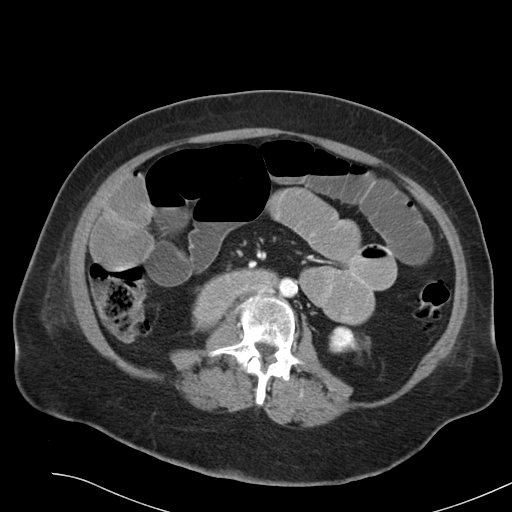
[im 57/89  soft-tissue]
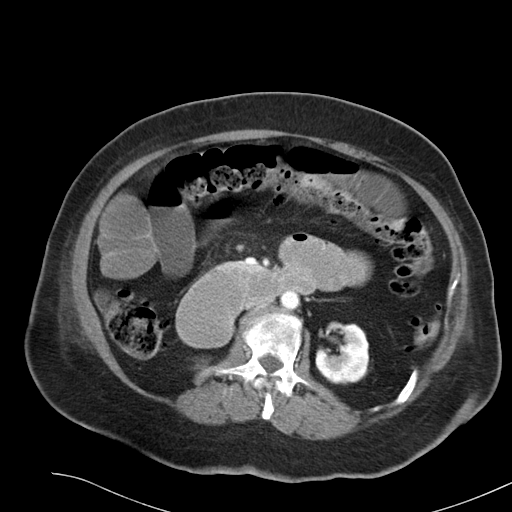
[im 57/89  bone]
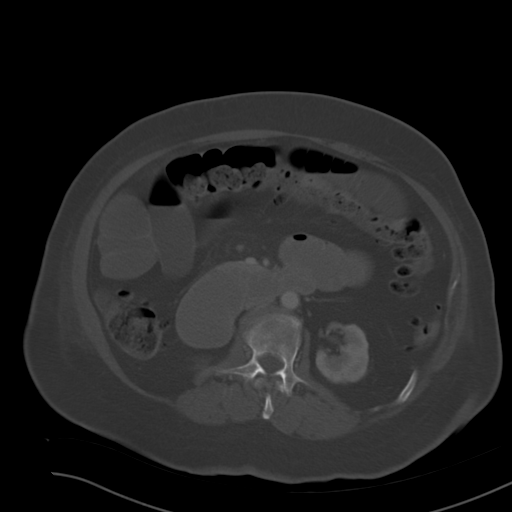
[im 63/89  soft-tissue]
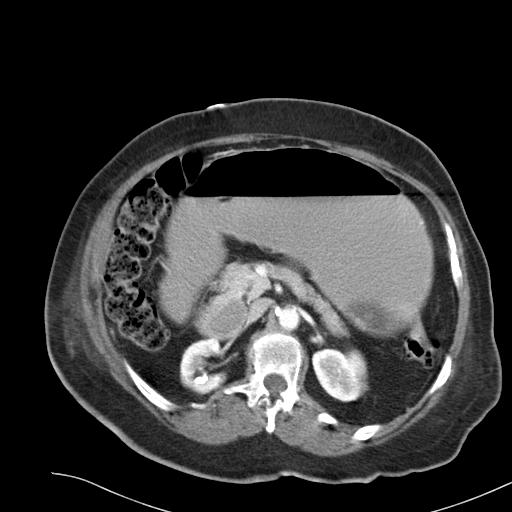
[im 68/89  soft-tissue]
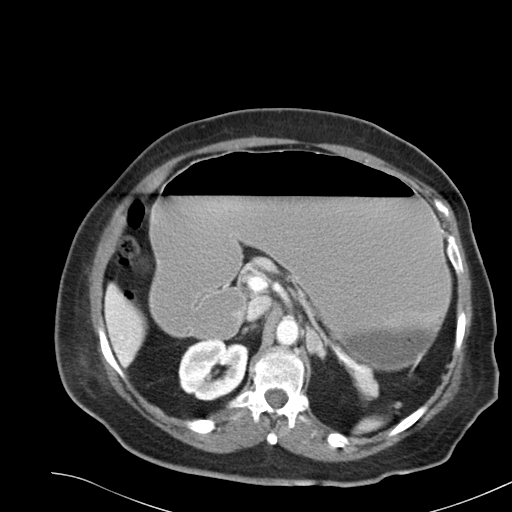
[im 78/89  soft-tissue]
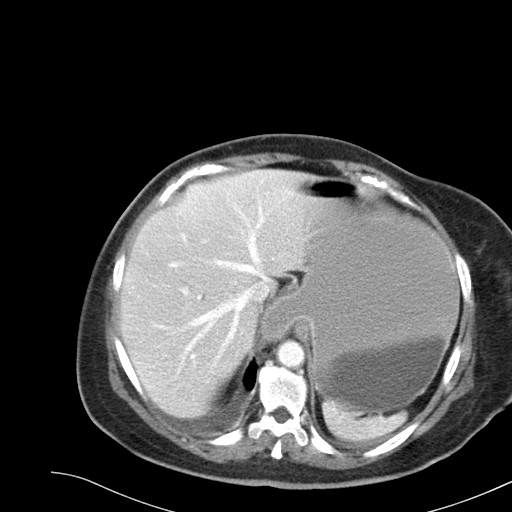
[im 83/89  soft-tissue]
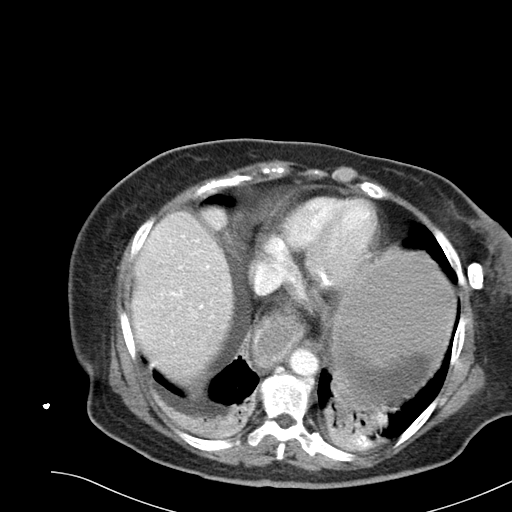

[Series 9: cor routine abd pel with · coronal · 0.73mm/px · 3 of 147 slices shown]
[im 49/147  soft-tissue]
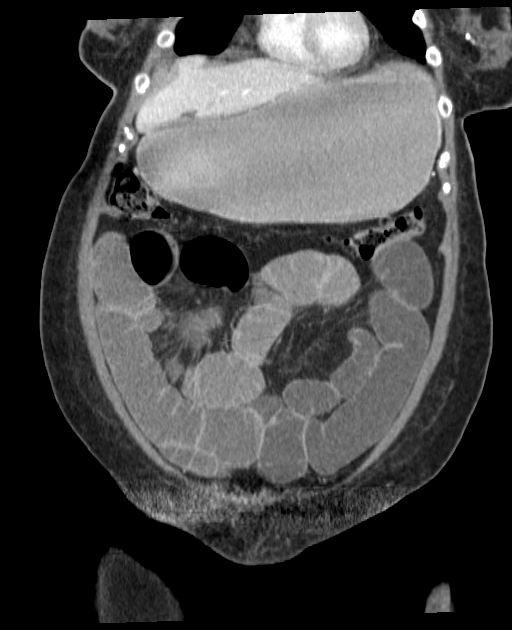
[im 65/147  soft-tissue]
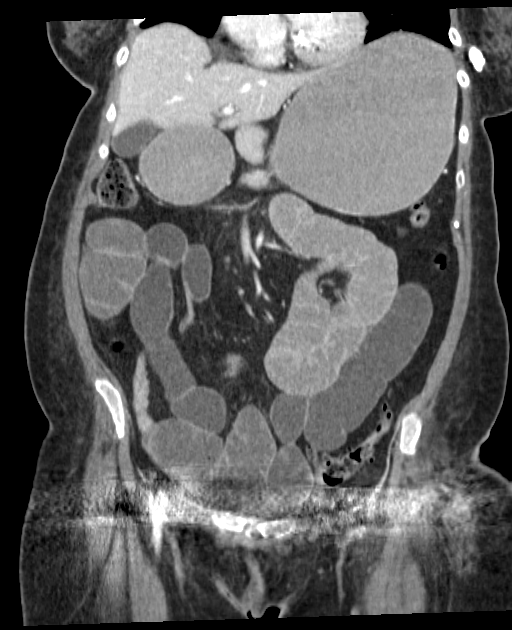
[im 82/147  soft-tissue]
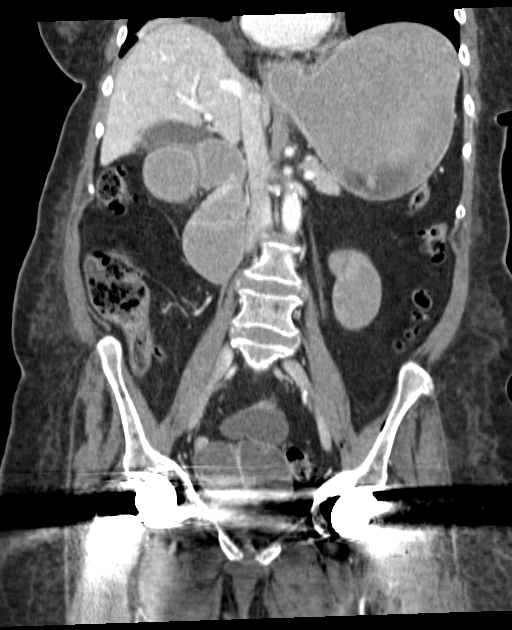

[16 of 46 positions shown; findings below may reference images not displayed]

FINDINGS: Mild patchy bilateral lower lobe opacities, atelectasis versus
pneumonia. Small right and trace left pleural effusions.

Coronary atherosclerosis. Atherosclerotic calcifications of the
mitral valve annulus.

Dilated, fluid-filled distal esophagus.  Distended stomach.

Liver, spleen, pancreas, and right adrenal gland are within normal
limits.

13 x 10 mm left adrenal nodule (series 2/ image 22), likely
reflecting a benign adrenal adenoma when correlating with prior
unenhanced CT.

Scattered areas of a bilateral renal cortical scarring. Multiple
right lower pole renal calculi measuring up to 6 mm (series 2/ image
27). 5 mm medial right upper pole renal cyst (series 2/ image 21).
No hydronephrosis.

Multiple dilated loops of proximal small bowel. Abrupt focal
transition involving a whirling loop of small bowel in the right mid
abdomen (series 2/ image 43). Distal small bowel in the right lower
quadrant is all decompressed (series 2/image 62). This appearance is
compatible with a high grade partial or complete distal small bowel
obstruction, likely on the basis of adhesions or possibly an
internal hernia.

Normal appendix. Extensive colonic diverticulosis, without
associated inflammatory changes.

Atherosclerotic calcifications of the abdominal aorta and branch
vessels.

No abdominopelvic ascites.

No suspicious abdominopelvic lymphadenopathy.

Pelvis is obscured by streak artifact from the patient's bilateral
hip prostheses.

Degenerative changes of the visualized thoracolumbar spine.
IMPRESSION: Findings compatible with high grade partial or complete distal small
bowel obstruction, with focal transition in the right mid abdomen,
likely on the basis of adhesions or possibly an internal hernia.

Mild patchy bilateral lower lobe opacities, atelectasis versus
pneumonia. Associated small right and trace left pleural effusions.

Additional ancillary findings as above.

## 2014-06-17 IMAGING — CR DG ABDOMEN 2V
1 series · 3 of 3 positions shown · non-contrast
Comparison: 06/01/2013.

CLINICAL DATA: Ileus.

EXAM:
ABDOMEN - 2 VIEW

[Series 9: x abdomen supine · 0.14mm/px · 3 of 3 slices shown]
[im 1/3]
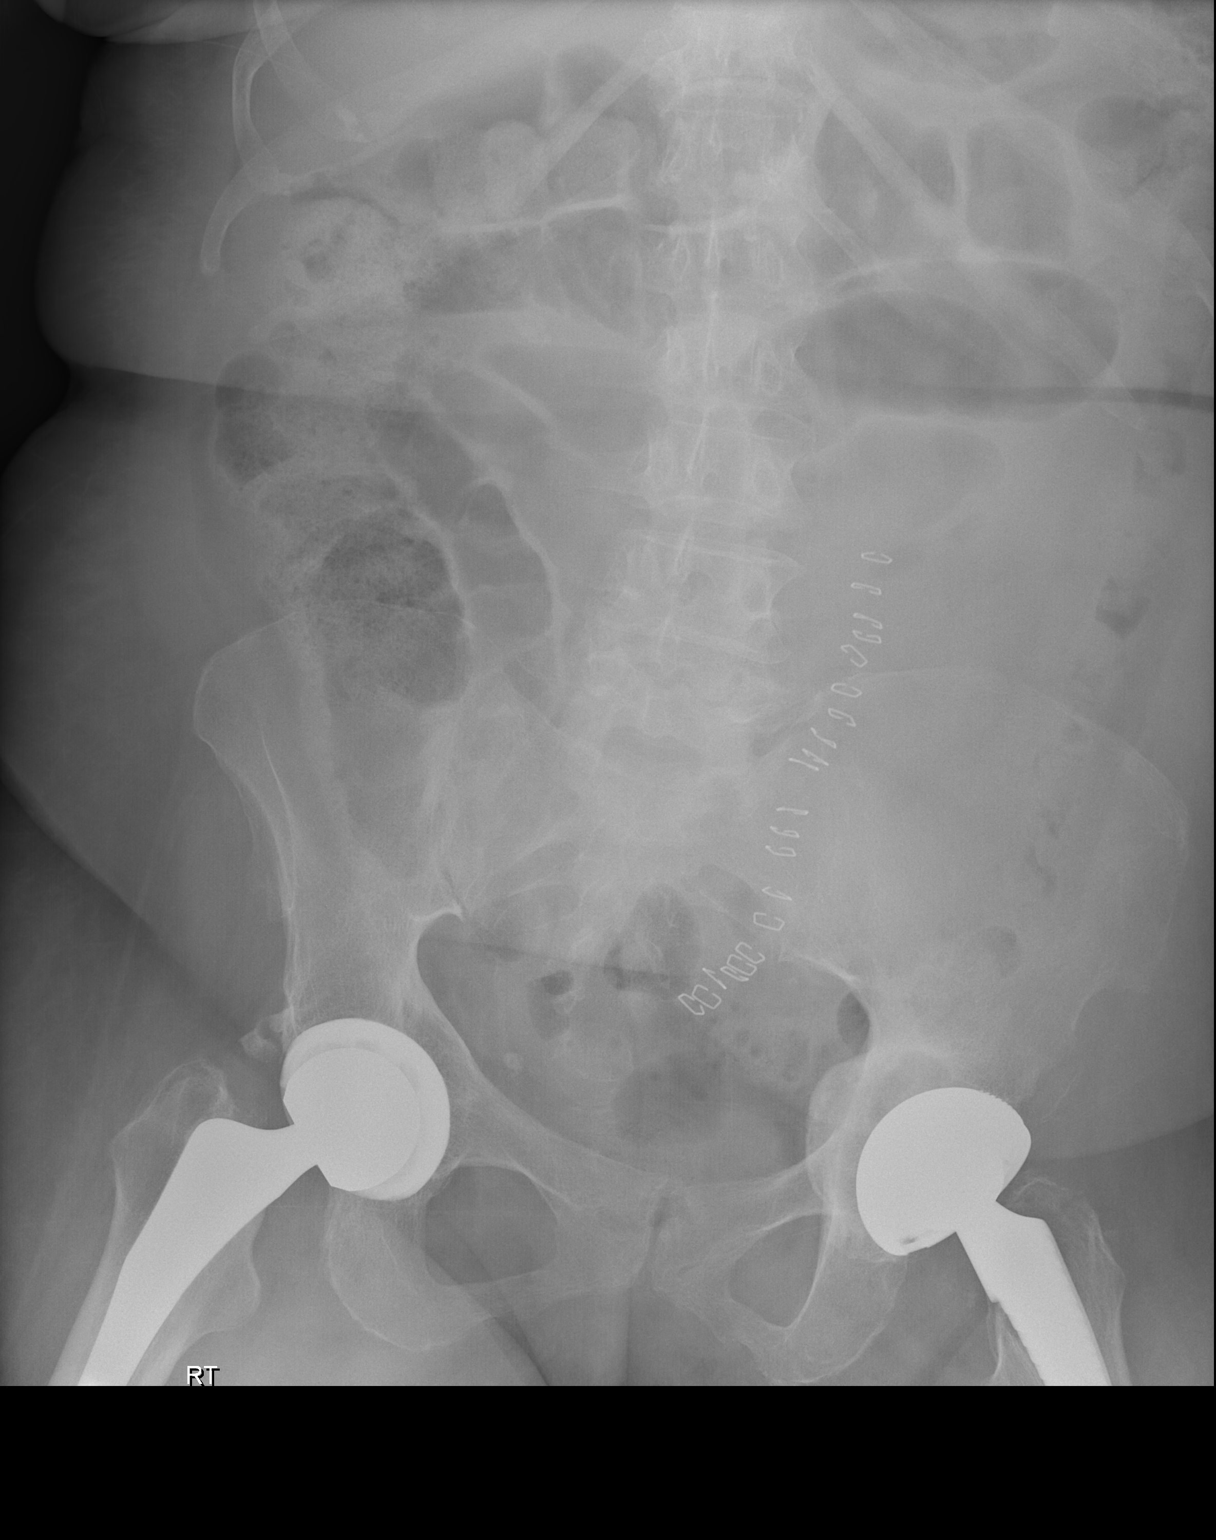
[im 2/3]
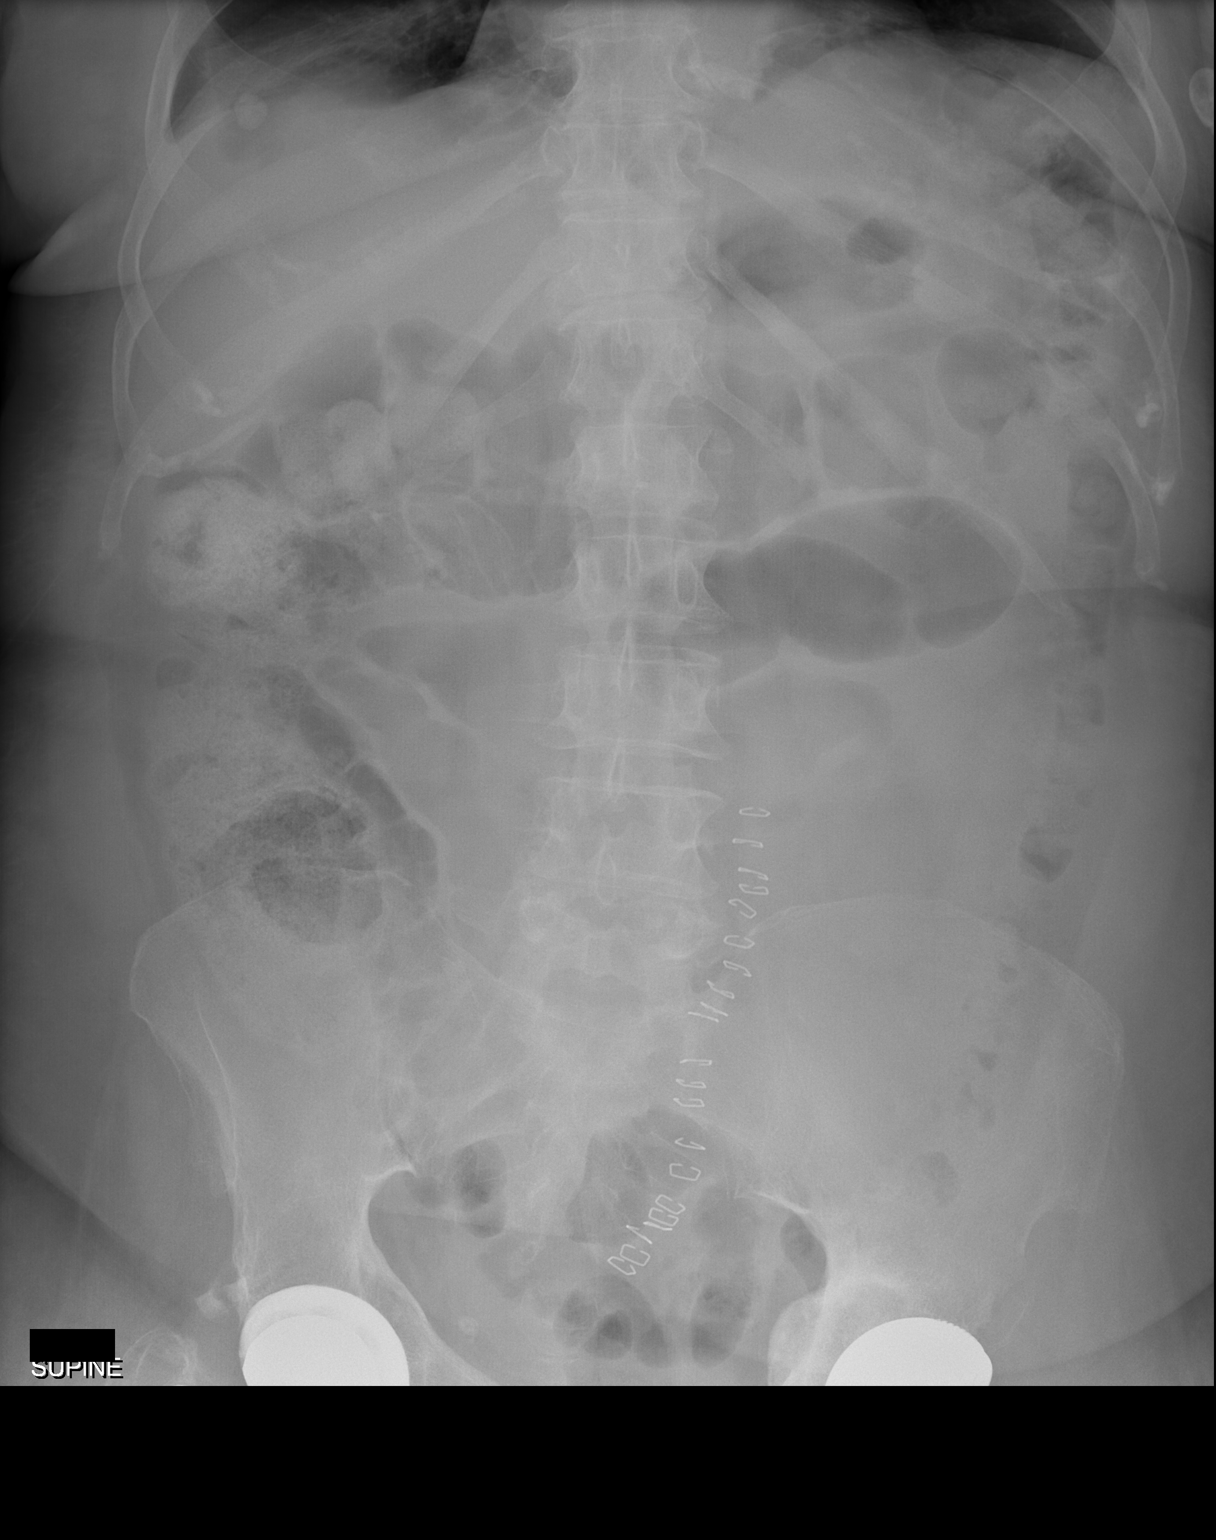
[im 3/3]
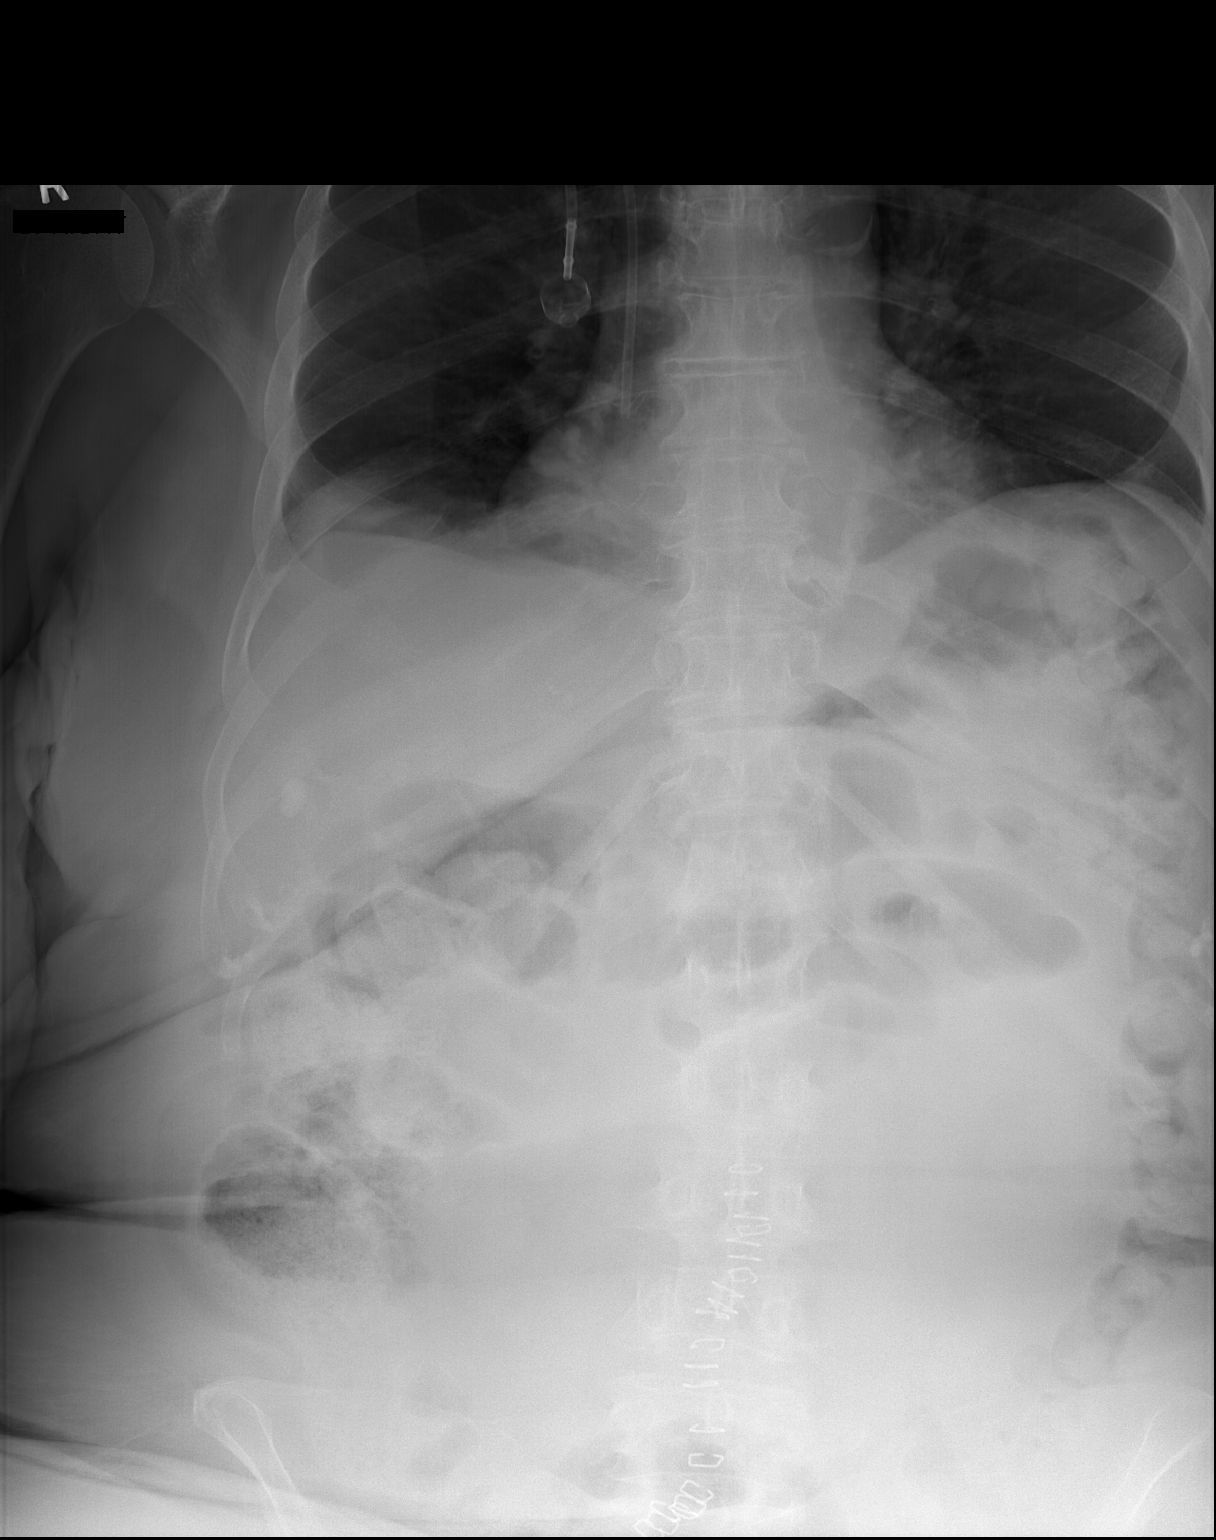

[3 of 3 positions shown; findings below may reference images not displayed]

FINDINGS: Interim removal of NG tube. Previously identified distended loops of
small bowel remain but the distention has improved. Stool is noted
in the colon. No free air identified. Surgical staples noted over
the abdomen. Bilateral hip replacements.
IMPRESSION: 1. Interim removal of NG tube.
2. Mild improvement of small bowel distention.

## 2014-06-30 ENCOUNTER — Emergency Department: Payer: Self-pay | Admitting: Emergency Medicine

## 2014-10-02 NOTE — H&P (Signed)
PATIENT NAME:  Janet Pope, CADE MR#:  [id number] DATE OF BIRTH:  04/25/1927  DATE OF ADMISSION:  05/31/2013  ADMITTING DIAGNOSES: 1.  High-grade small bowel obstruction, likely adhesive.  2.  Recent status post left total hip replacement.  3.  History of hypertension, hyperlipidemia and gout.    HISTORY OF PRESENT ILLNESS: This is an 81 year old black female with a history of osteoarthritis of her left hip, who underwent recent left total hip replacement on 05/22/2012 by Dr. Hessie Knows. Her postoperative stay was unremarkable. Her last bowel movement was a week ago. She was transferred to local rehabilitation facility on Monday of this week, where she reports having some mild abdominal pain and significant constipation since her admission. The patient then started having emesis yesterday. She has had brown, foul-smelling emesis since. Of note, the patient has had a previous cesarean section and hysterectomy through a lower midline incision in the distant past. No previous history of this in the past. Of note, the patient has been treated successfully for lymphoma, under the care of Dr. Ma Hillock at the [facility]. She has a right chest Infuse-a-Port. She has a history of axillary lymph node biopsy by my associate, Dr. Pat Patrick. The patient also has a history of ureteral stones requiring ureteral stent placement under Dr. Dene Gentry care. Recent imaging demonstrated remission of her lymphoma. She was successfully treated with chemotherapy.   While in the Emergency Room, the patient had a large amount of emesis, and a nasogastric tube was placed, with return of approximately 1200 mL total, emesis and nasogastric tube output. The patient currently denies any pain. She has not passed flatus in at least a day or two. Surgical services were asked to consult and admit.   ALLERGIES: INCLUDE ASPIRIN AND MILK.   MEDICATIONS:  1.  Amlodipine 2.5 mg by mouth once a day.  2.  Coreg 25 mg by mouth b.i.d.  3.  Lasix  20 mg by mouth once a day.  4.  Latanoprost 0.005% ophthalmic solution one drop each eye at bedtime.  5.  Omeprazole 2 mg by mouth delayed release capsule once a day.  6.  Oxybutynin 5 mg by mouth b.i.d.  7.  Simvastatin 20 mg by mouth daily. 8.  Timolol 0.5% ophthalmic solution one drop each eye at bedtime.    PAST MEDICAL HISTORY: Significant for  1.  Hypertension.  2.  Lymphoma, as described above. 3.  Kidney stones.    PAST SURGICAL HISTORY:  1.  Axillary lymph node biopsy.  2.  Port-A-Cath insertion.  3.  Right total hip replacement.  4.  History of left total hip replacement.  5.  Cesarean section and hysterectomy.    REVIEW OF SYSTEMS: As described above.   SOCIAL HISTORY: She currently does not smoke, does not drink. Residing in a local rehabilitation facility, as described above.   PHYSICAL EXAMINATION: GENERAL: The patient is very frail and weak appearing. Nasogastric tube is in place.  VITAL SIGNS: Temperature is 97.3, pulse 97, respiratory rate 20, blood pressure 120/72, pulse oximetry 95% on 4 liters.  LUNGS: Clear bilaterally.  HEART: Regular rhythm.  ABDOMEN: Soft and distended, but no peritoneal signs are present. There is a lower midline scar with no obvious hernia. There is left anterior hip and groin bandage from previous total hip replacement.  EXTREMITIES: Warm and well perfused with minimal edema.  RECTAL/GENITOURINARY: Examination is deferred at this time.  NEUROLOGIC: The patient is alert, answers questions appropriately, however, is somewhat confused at  other times.   LABORATORY VALUES: Glucose 126, BUN 36, creatinine 1.29, sodium 138, potassium 3.6, CO2 106, lipase is 2004, albumin is 2.5. Liver function tests are normal. White count is 15.8, hemoglobin is 10, hematocrit 32.2, platelet count 384,000. Review of CT scan with intravenous contrast demonstrates patchy lower lobe atelectasis, small trace bilateral pleural effusions. There is a dilated fluid-filled  esophagus and stomach. There is an adrenal nodule on the left side, likely representing adrenal adenoma. There are multiple dilated loops of proximal small bowel with an abrupt focal transition point seen in the right mid abdomen. Distal bowel was decompressed. Normal-appearing appendix with colonic diverticulosis present.   IMPRESSION: This is an 81 year old white female with leukocytosis, elevated lipase, elevated serum creatinine based on her size, as well as findings concerning for a high-grade small bowel obstruction. Given the course of her history, the patient needs to be resuscitated, watched closely with a low threshold for operative intervention. I am very concerned about her white count.   PLAN: The patient will be admitted, hydrated. Nasogastric tube. We will continue her beta blockade in intravenous form. I will contact Dr. Theodore Demark group regarding her readmission to the hospital.     ____________________________ Jeannette How. Marina Gravel, MD mab:cg D: 05/31/2013 17:03:48 ET T: 05/31/2013 22:32:42 ET JOB#: [id number]  cc: Elta Guadeloupe A. Marina Gravel, MD, <Dictator> Laurene Footman, MD Myrle Sheng Jimmye Norman, MD  Quantel Mcinturff Bettina Gavia MD ELECTRONICALLY SIGNED 06/03/2013 9:18

## 2014-10-02 NOTE — Consult Note (Signed)
PATIENT NAME:  Janet Pope, Janet Pope MR#:  [id number] DATE OF BIRTH:  01/20/1927  DATE OF CONSULTATION:  06/01/2013  REFERRING PHYSICIAN:  Elta Guadeloupe A. Marina Gravel, MD CONSULTING PHYSICIAN:  Demetrios Loll, MD  PRIMARY CARE PHYSICIAN:  Myrle Sheng. Jimmye Norman, MD  REASON FOR CONSULTATION:  Hypertension and medical management.   HISTORY OF PRESENT ILLNESS: The patient is an 81 year old African American female with a history of hypertension and hyperlipidemia, who presented to the ED, was admitted for nausea and vomiting with a diagnosis of small bowel obstruction. Since the patient has a history of hypertension, hyperlipidemia and many medical problems, Dr. Marina Gravel requested medical consult. The patient is on n.p.o. and NGT suction with yellowish drainage, about 400 mL. The patient feel nauseous, but denies any fever, chills, headache, dizziness. Denies any chest pain, palpitation, orthopnea or nocturnal dyspnea. No leg edema. The patient denies any dysuria, hematuria or incontinence.   PAST MEDICAL HISTORY: Hypertension, hyperlipidemia, GERD, osteoarthritis, obstructive sleep apnea, gout, diverticulosis, nephrolithiasis, history of valvular heart disease, lymphoma.  PAST SURGICAL HISTORY: Bilateral cataract surgery, left breast cyst resection, right hip replacement, right lithotripsy, hysterectomy, left total hip replacement on May 22, 2012; history of ureteric stones, recurring ureteric stent placement.   SOCIAL HISTORY: Denies any smoking or drinking or illicit drugs.   FAMILY HISTORY: Positive for hypertension, diabetes, asthma.   ALLERGIES: ASPIRIN AND MILK.   HOME MEDICATIONS:  1.  Timolol 0.5% ophthalmic solution 1 drop to each eye once a day at bedtime.  2.  Zocor 20 mg p.o. at bedtime.  3.  Oxybutynin 5 mg p.o. b.i.d.  4.  Omeprazole 20 mg p.o. once a day.  5.  Latanoprost 0.005% ophthalmic solution 1 drop to each eye once a day at bedtime.  6.  Lasix 20 mg p.o. daily p.r.n.  7.  Coreg 25 mg p.o. b.i.d.   8.  Norvasc 2.5 mg p.o. daily.   REVIEW OF SYSTEMS: CONSTITUTIONAL: The patient denies any fever or chills. No headache or dizziness but has weakness.  EYES: No double vision or blurry vision.  EAR, NOSE, THROAT: No postnasal drip, slurred speech or dysphagia.  CARDIOVASCULAR: No chest pain, palpitation, orthopnea or nocturnal dyspnea. No leg edema.  PULMONARY: No cough, sputum, shortness of breath or hemoptysis.  GASTROINTESTINAL: No abdominal pain but had some nausea. No vomiting or diarrhea. No melena or bloody stool.  GENITOURINARY: No dysuria, hematuria or incontinence.  SKIN: No rash or jaundice.  NEUROLOGY: No syncope, loss of consciousness or seizure.  ENDOCRINE: No polyuria, polydipsia, heat or cold intolerance.  HEMATOLOGY: No easy bruising or bleeding.   PHYSICAL EXAMINATION: VITAL SIGNS: Temperature 98.3, blood pressure 119/61, pulse 77, O2 saturation 99%.  GENERAL: The patient is alert, awake, oriented, in no acute distress.  HEENT: Pupils round, equal and reactive to light and accommodation. Dry oral mucosa. NGT with yellowish fluid suctioned.   NECK: Supple. No JVD or carotid bruits. No lymphadenopathy. No thyromegaly. CARDIOVASCULAR: S1, S2. Regular rate, rhythm. No murmurs, gallops.  PULMONARY: Bilateral air entry. No wheezing or rales. No use of accessory muscle to breathe.  ABDOMEN: Soft. No distention. No tenderness. No organomegaly. Bowel sounds present.  EXTREMITIES: No edema, clubbing or cyanosis. No calf tenderness. Bilateral pedal pulses present.  SKIN: No rash or jaundice.  NEUROLOGIC: A and O x 3. No focal deficit. Power 5 out of 5. Sensory intact.   LABORATORY DIAGNOSTIC AND RADIOLOGICAL DATA: Glucose 135, BUN 28, creatinine 1.36, electrolytes normal. WBC 10.1, hemoglobin 9.1, platelets 320. Urinalysis  showed WBC 20, RBC 18. Abdominal x-ray today shows slight improvement in the high-grade small bowel obstruction pattern. A CAT scan of the abdomen yesterday  showed high-grade partial or complete distal small bowel obstruction with focal transition in the right mid abdomen, mild patchy bilateral lower lobe opacity, atelectasis versus pneumonia associated with a small right and left pleural effusion.   IMPRESSIONS: 1.  Small bowel obstruction.  2.  Acute renal failure, possibly due to prerenal etiology due to nausea and vomiting.  3.  Urinary tract infection.  4.  Hypertension, controlled.  5.  Anemia.   RECOMMENDATIONS: 1.  Keep n.p.o., NGT suction.  2.  According to Dr. Marina Gravel, the patient may need surgery.  3.  For acute renal failure, continue IV fluid support with D5/half normal saline with potassium.  4.  Follow up BMP.  5.  For hypertension, the patient's blood pressure is very well controlled without p.o. medication. Agree with the Lopressor IV 5 mg IV q.6 hours.  6.  For UTI, continue Levaquin, follow up urine culture and sensitivity.   I discussed the patient's condition and plan of treatment with the patient and patient's brother, also discussed with Dr. Marina Gravel.   TIME SPENT: About 55 minutes.   ____________________________ Demetrios Loll, MD qc:cs D: 06/01/2013 11:32:42 ET T: 06/01/2013 14:44:16 ET JOB#: [id number]  cc: Demetrios Loll, MD, <Dictator> Demetrios Loll MD ELECTRONICALLY SIGNED 06/02/2013 15:47

## 2014-10-02 NOTE — Op Note (Signed)
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  PROCEDURE:  Left total hip replacement.   ANESTHESIA:  Spinal.   SURGEON:  Hessie Knows, M.D.   ASSISTATN:  April Tretha Sciara, nurse practitioner.   DESCRIPTION OF PROCEDURE:  The patient was brought to the Operating Room and after adequate anesthesia was obtained, the left hip was prepped and draped in the usual sterile fashion after bringing in the C-arm and making certain that good visualization was obtained.  After prepping and draping, appropriate patient identification and timeout procedures were completed.  A direct anterior approach was made with the incision centered over the greater trochanter and tensor fascia lata muscle.  The incision was carried down through the skin and subcutaneous tissue and the tensor fascia muscle was identified, the fascia incised and the muscle retracted laterally with the rectus retracted medially.  The lateral femoral circumflex vessels were identified, ligated and the anterior capsule exposed.  A large flap was then created based superior laterally and the neck was exposed.  Two cuts in the neck were made, first right under the neck and then a second below this to remove a napkin ring of bone and allow for easier access to the femoral head.  A corkscrew was then placed into the femoral head and the femoral head removed from the acetabulum with minimal difficulty despite the significant protrusio defect.  This was measured and bone graft obtained with reaming to fill this acetabular defect.  A 46 mm reamer was used to slightly score the protruding acetabular bone and then a 48 mm at the periphery bone graft was then impacted and reversed reamed with a 48 which appeared to be the appropriate  size for the solid peripheral bone.  The Versafit cup was then impacted into place with a 48 mm cup being inserted.  It felt quite stable and solid after impaction with good bone support and the protrusio defect now filled with the autogenous graft.  The femur was then approached with external rotation and release of the ischiofemoral and pubofemoral ligaments.  After release of these, the femur became mobilized.  The leg could be brought into extension and adduction and sequential broaching carried out up to a size 4.  With the size 4 trials were placed and the final components chosen.  A 4 standard hip stem was impacted down the canal with a size S head and a 28 mm DM liner to fit the 48 mm Versafit cup DM cup that had previously been inserted.  After reduction comparison x-ray showed what appeared to be good restoration of normal anatomy and correction of the defect from the protrusio defect.  The wound was thoroughly irrigated.  The tensor repaired using a heavy quill, subcutaneous drain, 2-0 quill subcutaneously, skin staples, Xeroform, 4 x 4's and ABDs.   ESTIMATED BLOOD LOSS:  75 mL.  Nothing could be returned through [facility].   COMPLICATIONS:  No complications.   SPECIMEN:  Removed femoral head which had been reamed, but showed extensive wear.    IMPLANTS:  Medacta Versafit cup DM 48 mm with associated liner, S 28 head and an Amis 4  standard HA-coated collared stem.     ____________________________ Laurene Footman, MD mjm:ea D: 05/22/2013 23:05:09 ET T: 05/23/2013 00:05:48 ET JOB#: [id number]  cc: Laurene Footman, MD, <Dictator> Christian Mate [facility] MD ELECTRONICALLY SIGNED 05/23/2013 8:26

## 2014-10-02 NOTE — Consult Note (Signed)
Brief Consult Note: Diagnosis: Left Total Knee Arthroplasty.   Patient was seen by consultant.   Comments: Patient denies any left hip pain. Left thigh is supple and nontender. Dressing is dry and intact. I will notify Dr. Rudene Christians of patient's readmission in the AM. Contact precautions due to (+) nasal swab for MRSA.  Electronic Signatures: Dereck Leep (MD)  (Signed 21-Dec-14 08:41)  Authored: Brief Consult Note   Last Updated: 21-Dec-14 08:41 by Dereck Leep (MD)

## 2014-10-02 NOTE — Discharge Summary (Signed)
PATIENT NAME:  Janet Pope, Janet Pope MR#:  [id number] DATE OF BIRTH:  20-Feb-1927  DATE OF ADMISSION:  05/22/2013 DATE OF DISCHARGE:  05/26/2013  ADMITTING DIAGNOSIS: Left hip osteoarthritis.   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   PROCEDURE: Left total hip replacement.   ANESTHESIA: Spinal.   SURGEON: Laurene Footman, M.D.   ASSISTANT: April Berndt, NP.   ESTIMATED BLOOD LOSS: 75 mL.   COMPLICATIONS: None.  SPECIMENS: Removed femoral head, which had been reamed and showed extensive wear.  IMPLANTS: Medacta Versa Fit cup DM 48 mm with associated liner, S-28 head, and an Amis 4 stem standard HA-coated collateral stem.   HISTORY: The patient was previously seen by Rachelle Hora PA-C. She had severe osteoarthritis of her left hip. She has had prior successful right total hip. She has had protrusio develop in the left hip and now had severe pain. She had limited ability to ambulate. She had a CT scan to check her anatomy and make sure there was anterior and posterior wall.   PHYSICAL EXAMINATION: LUNGS: Clear.  HEART: Regular rate and rhythm.  HEENT: She is edentulous.  MUSCULOSKELETAL: She has no rotation. She has slight shortening of the left leg. She is neurovascularly intact in the left lower extremity.   ASSESSMENT: Left hip degenerative arthritis with protrusio acetabulum.   HOSPITAL COURSE: The patient was admitted to the hospital on 05/22/2013. She had surgery that same day and was brought to the orthopedic floor from the PAC-U unit in stable condition. On postop day 1, the patient had a hemoglobin of 9. She had slow progress with physical therapy. On postop day 2, the patient's hemoglobin dropped to 7.9. She was given 1 unit of packed red blood cells. The patient's vital signs were stable. She had slow progress with physical therapy. On postop day 3, hemoglobin was up to 9. She was tolerating physical therapy well. She was having some urinary  retention so a Foley was reinserted and she was bladder trained. On postop day 4, the patient was urinating on her own, vital signs remained stable, and she was doing well. She was stable and ready for discharge to a rehab facility.   CONDITION AT DISCHARGE: Stable.   DISCHARGE INSTRUCTIONS: The patient may gradually increase weight-bearing on the effected extremity. She is to wear thigh-high TED hose on both legs and remove 1 hour per 8 hour shift. Use incentive spirometry every hour while awake and encourage cough and deep breathing. She may resume a regular diet as tolerated. Apply an ice pack to the affected area. Do not get the dressing or bandage wet or dirty. Call [facility] orthopedics if the dressing gets water under it. Leave the dressing on. Call [facility] orthopedics if any of the following occur: Bright red bleeding from the incision and wound, fever above 101.5 degrees, redness, swelling, or drainage at the incision. Call [facility] orthopedics if you experience any increased leg pain, numbness or weakness in your legs or bowel or bladder symptoms. Physical therapy and occupational therapy will be continued at rehab facility. Followup appointment with [facility] orthopedics in 2 weeks. She needs to follow-up with Dr. Rudene Christians.   DISCHARGE MEDICATIONS: 1.  Latanoprost 0.005% ophthalmic solution 1 drop to each eye once a day. 2.  Timolol 0.5% ophthalmic solution 1 drop to each eye once a day at bedtime. 3.  Oxybutynin 5 mg 1 tablet orally 2 times a day.  4.  Carvedilol 25 mg 1 tab orally  2 times a day. 5.  Simvastatin 20 mg oral tablet 1 tablet orally once a day at bedtime. 6.  Lasix 1 tablet orally once a day as needed for swelling or dyspnea. 7.  Amlodipine 2.5 mg oral tablet 1 tablet orally once a day in the morning. 8.  Omeprazole 20 mg oral delayed-release capsule 1 cap orally once a day in the morning. 9.  Tramadol 50 mg oral tablet 1 tablet orally 3 times a day as needed for  pain. 10.  Combivent 1 puff inhaled once a day as needed.  11.  Tylenol 500 mg 1 tablet orally every 4 hours needed for pain or temp greater than 100.4.  12.  Oxycodone 5 mg 1 tablet orally every 4 hours as needed for pain. 13.  Magnesium hydroxide 8% suspension 30 mL orally 2 times a day as needed for constipation. 14.  Lovenox 40 mg subcutaneous once a day for 14 days. 15.  Bisacodyl 10 mg rectal suppository 1 suppository rectally once a day as needed for constipation.  ____________________________ T. Rachelle Hora, PA-C tcg:sb D: 05/26/2013 08:16:25 ET T: 05/26/2013 08:41:32 ET JOB#: [id number]  cc: T. Rachelle Hora, PA-C, <Dictator> Duanne Guess [place] ELECTRONICALLY SIGNED 06/03/2013 8:00

## 2014-10-02 NOTE — Op Note (Signed)
PATIENT NAME:  Janet Pope, Janet Pope MR#:  [id number] DATE OF BIRTH:  Dec 31, 1926  DATE OF PROCEDURE:  06/01/2013  PREOPERATIVE DIAGNOSIS: High-grade distal small bowel obstruction.   POSTOPERATIVE DIAGNOSIS:  High-grade distal small bowel obstruction.   PROCEDURE PERFORMED: Exploratory laparotomy with lysis of adhesions.   SURGEON: Tyland Klemens A. Marina Gravel, M.D.   ASSISTANT: Scrub tech.  ANESTHESIA: General endotracheal.   FINDINGS: There was a complete small bowel obstruction in the near terminal ileum secondary to adhesions. There was a tight band across the near terminal ileum resulting in a complete bowel obstruction. The colon was decompressed. There were multiple adhesions of the omentum to the anterior abdominal wall.   SPECIMENS: None.   ESTIMATED BLOOD LOSS: 25 mL.   DRAINS: None.   COUNTS:  Lap and needle counts correct x 2.   DESCRIPTION OF PROCEDURE: With informed consent and supine position, general oral endotracheal anesthesia was induced. The patient's abdomen was widely prepped and draped with ChloraPrep solution. Timeout was observed.    The abdomen was entered through a skin incision from just above the previous midline scar, opening it with sharp dissection through skin and subcutaneous tissues to the fascia. Adhesiolysis of the omentum was taken down off the anterior abdominal wall utilizing electrocautery and sharp technique. The bowel was markedly distended. There was no bloody peritonitis. The bowel was viable.   The bowel was followed down into the pelvis where there were multiple interloop adhesions. There was an area of tight adhesion down to the pelvic sidewall of rather tight adhesive bands resulting in a complete small bowel obstruction. With the bowel completely freed and all adhesions lysed from the ligament of Treitz to the ileocecal valve, the abdomen was irrigated and aspirated dry.   Hemostasis on the operative field appeared to be adequate. A piece of Seprafilm was  placed down into the pelvis, covering the right pelvic sidewall. Omentum was then draped over the intestines and the fascia was closed from the extremes of the wound utilizing #1 running Vicryl suture. Subcutaneous tissues were irrigated utilizing sterile saline and aspirated dry. Hemostasis was ensured. The skin edges were reapproximated utilizing a skin stapler. Sterile occlusive dressing was placed. The patient was subsequently extubated and taken to the recovery room in stable and satisfactory condition by anesthesia services.     ____________________________ Jeannette How Marina Gravel, MD mab:cs D: 06/01/2013 18:14:11 ET T: 06/01/2013 20:58:10 ET JOB#: [id number]  cc: Elta Guadeloupe A. Marina Gravel, MD, <Dictator> Hortencia Conradi MD ELECTRONICALLY SIGNED 06/06/2013 11:21

## 2014-10-03 NOTE — Discharge Summary (Signed)
PATIENT NAME:  Janet Pope, Janet Pope MR#:  [id number] DATE OF BIRTH:  [birth year]  DATE OF ADMISSION:  05/31/2013 DATE OF DISCHARGE:  06/09/2013  FINAL DIAGNOSES:  1.  Adhesive small bowel obstruction.  2.  Recent total hip replacement.   HOSPITAL COURSE SUMMARY: The patient was admitted on the 20th of December with high-grade small bowel obstruction. Despite nasogastric tube insertion, the patient had failure to progress. Due to her duration of symptoms and bowel gas pattern, she was brought to the operating room on the 21st of December and laparotomy was performed with small bowel resection with small bowel adhesiolysis. Postoperatively, the patient had adequate pain control. She was continued on nasogastric suction. Physical therapy was involved. Orthopedic surgery did become involved because she was recently postop. Medicine became involved because of her medical comorbidities. On postoperative day #2, the patient had no flatus. Her activity was increased. She continued to have moderate NG output. Vital signs remained stable. On the 25th,  she was noted to be passing some gas. Her abdomen was soft and the wound seemed to be healing nicely. Nasogastric tube was able to be discontinued. Her diet was able to be advanced as well as her activity. She continued to improve. Arrangements were made for her to go home with family help and home health nursing. On the 29th, the patient was deemed suitable for discharge. She will follow up in my office in 7 to 10 days.   DISCHARGE MEDICATIONS: Can be found on reconciliation form.  ____________________________ Jeannette How. Marina Gravel, MD mab:aw D: 06/23/2013 06:35:50 ET T: 06/23/2013 10:03:56 ET JOB#: [id number]  cc: Elta Guadeloupe A. Marina Gravel, MD, <Dictator> Hortencia Conradi MD ELECTRONICALLY SIGNED 06/23/2013 10:58

## 2014-10-04 NOTE — Consult Note (Signed)
PATIENT NAME:  Janet Pope, Janet Pope MR#:  [id number] DATE OF BIRTH:  [birth year]  DATE OF CONSULTATION:  09/26/2011  REFERRING PHYSICIAN:  Abel Presto, MD CONSULTING PHYSICIAN:  Kylah Maresh C. Xandrea Clarey, MD  REASON FOR CONSULTATION: Nephrolithiasis and left hydronephrosis.   HISTORY OF PRESENT ILLNESS: This is an 81 year old female admitted 09/25/2011 with a two day history of intermittent left flank/abdominal pain with chills and weakness. Urinalysis was remarkable for pyuria and bacteriuria. She had leukocytosis. CT was remarkable for mild left hydronephrosis with a 6 mm left ureteropelvic junction stone. She is well known to me and has a history of prior stone disease. She underwent right ureteroscopy with follow-up lithotripsy of a partial right staghorn calculus in 2009. She does have residual right lower pole fragments, which have been asymptomatic. She was last seen in August 2012 with stable bilateral nephrolithiasis.   PAST MEDICAL HISTORY:  1. Hypertension.  2. Hyperlipidemia.  3. Gastroesophageal reflux disease.  4. Osteoarthritis.  5. Obstructive sleep apnea.  6. Gout. 7. Nephrolithiasis.  8. History of valvular heart disease.   PAST SURGICAL HISTORY:  1. Bilateral cataract.  2. Excision of a left breast cyst.  3. Right total hip replacement.  4. Hysterectomy.  5. Right ureteroscopy.  6. Right extracorporeal shock wave lithotripsy.   ALLERGIES: Aspirin, eggs, and milk.   SOCIAL HISTORY: The patient lives alone. No tobacco or alcohol use.   REVIEW OF SYSTEMS: CONSTITUTIONAL: Positive chills without fever. PULMONARY: Denies cough or shortness of breath.  CEREBROVASCULAR:  No chest pain or palpitations. GI: Denies nausea and vomiting. Positive left abdominal and flank pain. GENITOURINARY: Denies hematuria, dysuria, or incontinence. MUSCULOSKELETAL: Positive joint pain. NEUROLOGIC: No history of stroke. PSYCH: No depression. The remainder of review of systems is otherwise  noncontributory.   PHYSICAL EXAMINATION:   VITAL SIGNS: Temperature 98.8, blood pressure 114/70, and pulse 90.   GENERAL: Pleasant female in no acute distress.   ABDOMEN: Soft and nontender.   BACK: No CVA tenderness present.   DATA: Creatinine on admission 1.98 and 1.43 today.  Urinalysis has significant pyuria and bacteriuria.   Urine culture is pending. One blood culture is growing gram-negative rods in anaerobic bottle.   CT was reviewed. There is mild left hydronephrosis and a 6 mm left UPJ stone. There are nonobstructing left renal calculi and lower pole right renal calculi. She was incidentally noted to have a large soft tissue density in the right pelvis measuring approximately 7 cm consistent with adenopathy.   IMPRESSION:  1. Left ureteropelvic junction stone with mild hydronephrosis.  2. Bilateral nephrolithiasis.  3. Urinary tract infection.   RECOMMENDATIONS: She has pyuria, bacteriuria, and positive blood culture and would not recommend definitive treatment of the stone. Because of her hydronephrosis and potential for clinical deterioration, I would recommend placement of a left ureteral stent. The stone can be treated with follow-up shock wave lithotripsy. Stent placement was discussed including the potential risk of bleeding, infection, and ureteral injury. The possibility of stone impaction and inability to place a stent, from a retrograde approach, was discussed, which would require percutaneous nephrostomy. She indicated all questions were answered to her satisfaction and desires to proceed.  ____________________________ Ronda Fairly Bernardo Heater, MD scs:slb D: 09/26/2011 18:20:14 ET T: 09/27/2011 09:56:38 ET JOB#: [id number]  cc: Nicki Reaper C. Bernardo Heater, MD, <Dictator> Abbie Sons MD ELECTRONICALLY SIGNED 10/05/2011 18:47

## 2014-10-04 NOTE — Op Note (Signed)
PATIENT NAME:  Janet Pope, CARTE MR#:  [id number] DATE OF BIRTH:  08-Feb-1927  DATE OF PROCEDURE:  09/28/2011  PREOPERATIVE DIAGNOSIS: Lymphadenopathy.  POSTOPERATIVE DIAGNOSIS: Lymphadenopathy.  PROCEDURE PERFORMED: Right axillary lymph node biopsy.   SURGEON: Rodena Goldmann, MD  ANESTHESIA: General.   OPERATIVE PROCEDURE: With the patient in the supine position after the induction of appropriate general anesthesia, the patient's right axilla was prepped with Betadine and draped with sterile towels. An incision was made over the palpable lesion and carried down through the subcutaneous tissue with Bovie electrocautery. The large lymph node was identified and dissected free from the surrounding tissue without difficulty. No vascular or nerve damage was visualized. The specimen was passed off the table. The area was irrigated and closed in two layers using 3-0 Vicryl subcutaneous suture and 4-0 Vicryl running subcuticular suture. Benzoin and Steri-Strips were applied. The wounds were dressed sterilely. The patient was returned to the recovery room having tolerated the procedure well. Sponge, instrument, and needle counts were correct times two in the operating room.   ____________________________ Micheline Maze, MD rle:bjt D: 09/28/2011 11:02:20 ET T: 09/28/2011 14:38:59 ET JOB#: [id number]  cc: Rodena Goldmann III, MD, <Dictator> Sandeep R. Ma Hillock, MD Rodena Goldmann MD ELECTRONICALLY SIGNED 10/06/2011 22:19

## 2014-10-04 NOTE — Consult Note (Signed)
I will be out of the office until 4/22. if pt still in house will check back re lithotripsy scheduling. if d/ced wour office will contact.  Please contact urologist on call for any problems.  Electronic Signatures: Abbie Sons (MD)  (Signed on 17-Apr-13 17:39)  Authored  Last Updated: 17-Apr-13 17:39 by Abbie Sons (MD)

## 2014-10-04 NOTE — Consult Note (Signed)
Brief Consult Note: Diagnosis: left upj stone with hydonephrosis/uti/sirs.   Patient was seen by consultant.   Consult note dictated.   Comments: recc left ureteral stent placement 4/17.  Electronic Signatures: Abbie Sons (MD)  (Signed 16-Apr-13 18:24)  Authored: Brief Consult Note   Last Updated: 16-Apr-13 18:24 by Abbie Sons (MD)

## 2014-10-04 NOTE — H&P (Signed)
PATIENT NAME:  Janet Pope, Janet Pope MR#:  [id number] DATE OF BIRTH:  12-25-1926  DATE OF ADMISSION:  09/25/2011  REFERRING PHYSICIAN: Dr. Roxine Pope.  PRIMARY CARE PHYSICIAN: Dr. Tomasita Pope at [facility].   PRESENTING COMPLAINT: Chills, left side abdominal pain, weakness.   HISTORY OF PRESENT ILLNESS: Janet Pope is a pleasant 81 year old woman with a history of hypertension, gastroesophageal reflux disease, osteoarthritis, obstructive sleep apnea, no longer on CPAP, history of nephrolithiasis, hyperlipidemia, who presents from home with reports of developing left side abdominal pain for the past two days with development of chills last night and weakness where she was not feeling herself, usually gets up early in the morning, but had difficulty getting out of bed today. Reports that her symptoms have improved since arrival. She denies any nausea or vomiting denies any subjective fevers. No chest pain, shortness of breath, dyspnea on exertion. No orthopnea, PND, or lower extremity edema. Reports that her appetite is good.   PAST MEDICAL HISTORY:  1. Hypertension.  2. Hyperlipidemia.  3. Gastroesophageal reflux disease.  4. Osteoarthritis.  5. Obstructive sleep apnea. The patient has taken herself off CPAP.  6. Gout.  7. Diverticulosis.  8. Nephrolithiasis with history of right lithotripsy.  9. History of valvular heart disease. Last echocardiogram on January 2011 with moderate MR, mild to moderate TR, and normal ejection fraction.  PAST SURGICAL HISTORY:  1. Bilateral cataracts.  2. Left breast cyst resection.  3. Right total hip replacement.  4. Right lithotripsy.  5. Hysterectomy.   ALLERGIES: Aspirin, eggs, and milk.   FAMILY HISTORY: Mother had asthma. The patient's sister is deceased, had asthma, hypertension, and diabetes. Brother died in an accident.    SOCIAL HISTORY: The patient has been widowed since 1992, lives alone in [place]. No children. Denies any tobacco, alcohol  or drug use. She used to work at an [employer] administrator, currently retired.   REVIEW OF SYSTEMS: CONSTITUTIONAL: No fevers. Endorses chills. EYES: No visual changes. She has a history of cataracts. ENT: No epistaxis or discharge. RESPIRATORY: No cough, wheezing, or hemoptysis. CARDIOVASCULAR: No chest pain, orthopnea, palpitations, or syncope. GASTROINTESTINAL: No nausea, vomiting, or diarrhea. Reports left-sided abdominal pain as per history of present illness. No hematemesis or melena. GU: Denies any dysuria or hematuria or increased urinary frequency. ENDOCRINE: No polyuria or polydipsia. HEMATOLOGIC: No easy bleeding. SKIN: No ulcers. MUSCULOSKELETAL: Has osteoarthritis status post hip replacement. NEUROLOGIC: No history of stroke or seizure. PSYCH: Denies any suicidal ideation.   PHYSICAL EXAMINATION:  VITAL SIGNS: Temperature 99.6, pulse 77, respiratory rate 20, blood pressure 107/67, sating at 99% on room air.   GENERAL: Lying in bed in no apparent distress.   HEENT: Normocephalic, atraumatic. Pupils equal, symmetric, anicteric. Nares without discharge. She has pale conjunctiva, slightly dry mucous membrane.   NECK: Soft and supple without adenopathy or JVP.   CARDIOVASCULAR: Non-tachy. No murmurs, rubs, or gallops.   LUNGS: Basilar crackles. No use of accessory muscles or increased respiratory effort.   ABDOMEN: Soft. Positive bowel sounds. Unable to appreciate any tenderness with deep palpation. No rebound or guarding.   MUSCULOSKELETAL: No CVA tenderness. No joint effusion.   EXTREMITIES: No edema. Dorsal pedis pulses intact.   NEUROLOGIC: No dysarthria or aphasia. Symmetrical strength.   PSYCH: She is alert and oriented. The patient is cooperative.   PERTINENT LABS AND STUDIES: CT of abdomen and pelvis revealing for soft tissue density with right pelvic adenopathy and some retroperitoneal adenopathy concerning for underlying lymphoma. There is obstructing left  ureteropelvic junction stone  with mild hydronephrosis and bilateral nephrolithiasis. There is also scattered colonic diverticulosis without evidence of diverticulitis. The lung bases show some dependent atelectasis with mild bronchitis possibly with early bronchiectasis. Urinalysis with specific gravity of 1.019, blood 2+, pH 6, protein 100 mg/dL, nitrite negative, leukocyte esterase 3+, RBC 17 per high-power field, WBC 701 per high-power field, bacteria 3+. WBC 21.7, hemoglobin 10.1, hematocrit 31.1, platelet 153, MCV 80, lipase 105, glucose 125, BUN 22, creatinine 1.98, sodium 143, potassium 3.7, chloride 109, carbon dioxide 21, calcium 9.5, total bilirubin 0.9, alkaline phosphatase 60, ALT 11, AST 21, total protein 7.3, albumin 3.1, troponin less than 0.02. EKG with sinus rate of 94. No ST elevation or depression. There is T wave inversion in aVR that is nonspecific.   ASSESSMENT AND PLAN: Ms. Janet Pope is a pleasant 81 year old woman with history of valvular heart disease, obstructive sleep apnea, osteoarthritis, gastroesophageal reflux disease, hypertension, hyperlipidemia, nephrolithiasis, diverticulosis, and gout presenting with complaint of left-sided abdominal pain, chills and weakness.  1. Acute renal failure in the setting of urinary tract infection and systemic inflammatory response syndrome with nephrolithiasis and mild left hydronephrosis and dehydration. Continue IV fluids and ceftriaxone. She received Levaquin in the ED. Continue to follow ins and outs and daily creatinine. We will obtain urology consultation awaiting results for urine culture and blood culture.  2. Incidental finding of pelvic and retroperitoneal adenopathy concerning for lymphoma. Obtain oncology consultation.  3. Hypertension. Restart carvedilol and amlodipine.  4. Hyperlipidemia. Restart simvastatin.  5. Prophylaxis with omeprazole, TEDs and SCDs.       TIME SPENT: Approximately 50 minutes were spent on patient care.    ____________________________ Janet Ohara, MD ap:ap D: 09/25/2011 23:12:07 ET T: 09/26/2011 07:21:30 ET JOB#: [id number]  cc: Janet Stalzer, MD, <Dictator> Janet Sheng. Jimmye Norman, MD Janet Ohara MD ELECTRONICALLY SIGNED 10/09/2011 0:11

## 2014-10-04 NOTE — H&P (Signed)
PATIENT NAME:  Janet Pope, Janet Pope MR#:  [id number] DATE OF BIRTH:  [birth year]  DATE OF ADMISSION:  09/25/2011  ADDENDUM   MEDICATION LIST:  1. Amlodipine 2.5 mg daily.  2. Carvedilol 25 mg b.i.d.  3. Combivent inhaler as needed.  4. Omeprazole 20 mg daily.  5. Oxybutynin 5 mg t.i.d.  6. Simvastatin 20 mg at bedtime.    ____________________________ Rita Ohara, MD ap:ap D: 09/25/2011 23:23:19 ET T: 09/26/2011 07:40:45 ET JOB#: [id number]  cc: Brien Few Ina Scrivens, MD, <Dictator> Myrle Sheng. Jimmye Norman, MD Rita Ohara MD ELECTRONICALLY SIGNED 10/09/2011 0:11

## 2014-10-04 NOTE — Consult Note (Signed)
ONCOLOGY followup note - overall doing steady, still weak. No pain issues.no fever or chills. Eating better.resting in bed, alert and oriented, NAD        vitals - afebrile, stable        lungs - b/l diminished BS                abd - soft, NT     - WBC 9.9, Hb 9.4, plts 135, Cr 1.36, LDH 61  81 year old female patient who gives history of breast cancer treated in [place] more than five years ago, now admitted with left-sided abdominal pain which is clinically improving. CT scan, however, shows pelvic sidewall mass suggestive of lymphadenopathy along with retroperitoneal lymphadenopathy raising concern of lymphoma. Patient also has enlarged right axillary lymph node palpable on clinical exam. PET scan shows generalised lymphadenopathy with mild SUV uptake indicative of lymphoma versus other etiology. Will request surgical consult for excision LN bx from axilla to get tissue diagnosis. Based upon bx report will make further treatment plan. No acute pain issues at this time. Will continue to follow. Patient agreeable to this plan.     Electronic Signatures: Jonn Shingles (MD)  (Signed on 18-Apr-13 00:17)  Authored  Last Updated: 18-Apr-13 00:17 by Jonn Shingles (MD)

## 2014-10-04 NOTE — Consult Note (Signed)
PATIENT NAME:  Janet Pope, Janet Pope MR#:  [id number] DATE OF BIRTH:  [birth year]  DATE OF CONSULTATION:  09/26/2011  REFERRING PHYSICIAN:  Dr. Inez Catalina  CONSULTING PHYSICIAN:  Reagyn Facemire R. Ma Hillock, MD  REASON FOR CONSULTATION: Soft tissue mass in the right pelvic sidewall suggestive of extensive adenopathy, retroperitoneal adenopathy.   HISTORY OF PRESENT ILLNESS: Patient is an 81 year old female who has been admitted to hospital on 04/15 with complaints of left-sided abdominal pain, weakness and chills. Patient states that weakness has been ongoing for the past 2 to 3 months. She has past medical history significant for hypertension, GERD, osteoarthritis, obstructive sleep apnea no longer on CPAP, history of nephrolithiasis, hyperlipidemia. States that her abdominal pain is improving today. Is overall weak and mostly resting in bed at this time. Denies any new diarrhea or constipation. No new blood in stools, dysuria or hematuria. CT scan of the abdomen and pelvis with contrast shows soft tissue density with right pelvic adenopathy and retroperitoneal adenopathy concerning for underlying lymphoma, obstructing left UP junction stone with mild hydronephrosis and bilateral nephrolithiasis, scattered colonic diverticulosis. Patient states that she has had history of breast cancer treated in [place] more than five years ago, states that she had surgery and chemotherapy but otherwise does not remember other details.   PAST MEDICAL HISTORY: As in history of present illness above. In addition:  1. History of valvular heart disease, echocardiogram January 2011 showed moderate MR, mild to moderate TR.  2. Gout.  3. Bilateral cataracts.  4. Left breast cyst resection.  5. Right total hip replacement.  6. Right lithotripsy.  7. Hysterectomy.   FAMILY HISTORY: Patient's sister had kidney cancer, otherwise remarkable for hypertension, diabetes, asthma.   SOCIAL HISTORY: Denies any smoking, alcohol usage. She  lives alone. Used to work in Education administrator, currently retired.   ALLERGIES: Aspirin, eggs and milk.   CURRENT MEDICATIONS IN HOSPITAL:  1. Norvasc 2.5 mg p.o. daily. 2. Coreg 25 mg b.i.d.  3. Rocephin 1 gram IV q.24h.  4. Omeprazole 20 mg q.a.m.  5. Oxybutynin 5 mg p.o. t.i.d.  6. Simvastatin 20 mg at bedtime. 7. Tylenol p.r.n.  8. Norco 5/325, 1 to 2 tablets every four hours p.r.n. for pain.  9. Zofran 4 mg IV every four hours p.r.n. for nausea.   REVIEW OF SYSTEMS: CONSTITUTIONAL: As in history of present illness. Denies fevers or chills. HEENT: Currently denies headaches, dizziness, epistaxis, ear or jaw pain. CARDIAC: Denies any angina, palpitation, orthopnea, or paroxysmal nocturnal dyspnea. LUNGS: Denies any new dyspnea, cough, chest pain or hemoptysis. ABDOMEN: As in history of present illness. No bright red blood in stools or melena. GENITOURINARY: As in history of present illness. SKIN: No new rashes or pruritus. HEMATOLOGIC: Denies obvious bleeding symptoms. MUSCULOSKELETAL: No new bone pains. NEUROLOGIC: Denies any focal weakness, seizures, or loss of consciousness. ENDOCRINE: No polyuria, polydipsia. Appetite is fair.   PHYSICAL EXAMINATION:  GENERAL: Patient is weak, otherwise resting in bed, alert and oriented in no acute distress. No icterus. Mild pallor.   VITAL SIGNS: Temperature 97.4, pulse 81, respirations 18, blood pressure 104/62, 96% on room air.   HEENT: Normocephalic, atraumatic. Extraocular movements intact. Sclerae anicteric. No oral thrush.   NECK: Supple without lymphadenopathy.  CARDIOVASCULAR: S1, S2, regular rate and rhythm.   LUNGS: Lungs show bilateral good air entry, decreased at bases, no rhonchi.   ABDOMEN: Soft, nontender. No hepatosplenomegaly clinically.   EXTREMITIES: Trace edema.   SKIN: No generalized rashes or major bruising.  LYMPHATICS: No cervical adenopathy. Has right axillary node palpable around 1 to 2 cm. No inguinal adenopathy.    NEUROLOGIC: Patient alert and oriented, nonfocal, cranial nerves are intact.   LABORATORY, DIAGNOSTIC, AND RADIOLOGICAL DATA: WBC 19,100, hemoglobin 9.9, platelets 135, ANC 17,000, creatinine 1.43, potassium 3.9, calcium 9.0, magnesium 1.8. Blood culture shows gram-negative rod in anaerobic bottle only. Urinalysis showed 3+ leukocyte esterase, 2+ blood, 700 WBCs.   IMPRESSION AND RECOMMENDATIONS: 81 year old female patient who gives history of breast cancer treated in [place] more than five years ago, now admitted with left-sided abdominal pain which is clinically improving. CT scan, however, shows pelvic sidewall mass suggestive of lymphadenopathy along with retroperitoneal lymphadenopathy raising concern of lymphoma. Patient also has enlarged right axillary lymph node palpable on clinical exam. She definitely needs further work-up to rule out lymphoma versus other malignancy. Plan therefore is to obtain PET scan. We will add serum LDH to labs drawn today. Based upon findings of PET scan, patient explained that we will need to pursue biopsy to get tissue diagnosis and then make further plan of management. No acute pain issues at this time. Will continue to follow. Patient agreeable to this plan.   Thank you for the referral. Please feel free to contact me if additional questions.   ____________________________ Rhett Bannister Ma Hillock, MD srp:cms D: 09/26/2011 23:33:02 ET T: 09/27/2011 06:08:13 ET JOB#: [id number]  cc: Trevor Iha R. Ma Hillock, MD, <Dictator> Alveta Heimlich MD ELECTRONICALLY SIGNED 09/28/2011 10:01

## 2014-10-04 NOTE — Discharge Summary (Signed)
PATIENT NAME:  Janet Pope, Janet Pope MR#:  [id number] DATE OF BIRTH:  03-11-1927  DATE OF ADMISSION:  09/25/2011 DATE OF DISCHARGE:  09/28/2011  HISTORY: For a detailed note, please take a look at the history and physical done on admission by Dr. Inez Catalina.   DIAGNOSES AT DISCHARGE:  1. Acute renal failure in the setting of urinary tract infection, systemic inflammatory response syndrome and nephrolithiasis.  2. Hydronephrosis with nephrolithiasis status post left ureteral stent placement. 3. Urinary tract infection with sepsis.  4. Hypertension.  5. Hyperlipidemia.  6. Urinary incontinence.  7. Pelvic and axillary lymphadenopathy with a positive positron emission tomography scan status post axillary excisional biopsy.   DIET: The patient is being discharged on a low-sodium, low-fat diet.   ACTIVITY: As tolerated.   FOLLOWUP:  1. Follow-up with Dr. Bernardo Heater in the next 1 to 2 weeks.  2. Follow-up with Dr. Ma Hillock in the next 1 to 2 weeks.  DISCHARGE MEDICATIONS:  1. Combivent inhaler as needed.  2. Omeprazole 20 mg daily.  3. Simvastatin 20 mg at bedtime. 4. Coreg 25 mg b.i.d.  5. Oxybutynin 5 mg t.i.d. as needed. 6. Amlodipine 2.5 mg daily. 7. Ciprofloxacin 500 mg b.i.d. x10 days.   [place] COURSE:  1. Dr. John Giovanni, urology. 2. Dr. Bronson Ing, general surgery. 3. Dr. Leia Alf, hematology/oncology.   PERTINENT STUDIES DURING THE HOSPITAL COURSE: CT scan of the abdomen and pelvis done without contrast on admission showing a soft tissue density with right pelvic adenopathy and some retroperitoneal adenopathy concerning for underlying lymphoma, obstructing left ureteropelvic junction stone with mild hydronephrosis and bilateral nephrolithiasis, scattered colonic diverticulosis with no evidence of acute diverticulitis. An abdomen three-way showing unremarkable abdominal series. A PET scan done on 04/17 showing axillary and pelvic adenopathy with possible  mediastinal and hilar adenopathy with a very mild uptake of F18-FDG lymphoma as the primary consideration. Some scattered retroperitoneal lymph nodes are also noted. This may be a weakly PET-positive process. Biopsy is recommended. Also, a KUB done on 04/18 showing no definite left renal or ureteral calcifications are identified. A left double-J ureteral stent is present. Right nephrolithiasis.   HOSPITAL COURSE: This is an 81 year old female with medical problems as mentioned above who presented to the hospital on 09/25/2011 due to chills, left-sided abdominal pain and weakness.  1. Acute renal failure. This was likely in the setting of urinary tract infection with systemic inflammatory response syndrome and underlying left-sided nephrolithiasis. The patient was started empirically on IV ceftriaxone. Also, given aggressive IV fluids and also urology consult was obtained. Dr. Bernardo Heater ended up seeing the patient and thought the renal failure could be related to obstruction. Therefore, the patient underwent a left-sided ureteral stent placement. The patient's creatinine since then has significantly improved. She is voiding well. Her creatinine on admission was as high as 1.9. It is now down to 1.3. She currently has no abdominal pain, nausea, or vomiting. Therefore, is being discharged home with close follow-up with urology as an outpatient.  2. Systemic inflammatory response syndrome. This is likely secondary to the nephrolithiasis and urinary tract infection. The patient is presently hemodynamically stable after given IV fluids and IV antibiotics. She is afebrile. Her urine culture grew out Klebsiella, but her blood culture grew out Proteus. Her repeat blood cultures are negative presently. She currently is being discharged on a p.o. course of ciprofloxacin.  3. Urinary tract infection with sepsis. The patient's urinary tract infection was secondary to Klebsiella, but her blood  cultures are positive for  Proteus. Both are pansensitive. As mentioned, repeat blood cultures are negative. She received four days of IV ceftriaxone therapy in the hospital and currently is being discharged on a 10-day course of p.o. ciprofloxacin as stated.  4. Incidental finding of pelvic and retroperitoneal lymphadenopathy. This was seen on the CT scan done when she presented to the hospital. She has a history of breast cancer but the lymphadenopathy was suspicious for lymphoma. Therefore, the patient was seen by oncology by Dr. Ma Hillock. She underwent a PET scan which was positive. She underwent an axillary lymphadenopathy done by Dr. Bronson Ing today on the right side. She will follow up with Dr. Ma Hillock at the [facility] as an outpatient for her pathology result and further work-up for her lymphadenopathy has stated. The patient presently is hemodynamically stable and does not complain of any abdominal pain.  5. Hypertension. The patient remained hemodynamically stable on her Coreg and amlodipine. She will resume that.  6. Hyperlipidemia. The patient was continued on her simvastatin.  7. Urinary incontinence. The patient is already on oxybutynin. She will resume that upon discharge.   CODE STATUS: The patient is a DO NOT INTUBATE/DO NOT RESUSCITATE.   TIME SPENT WITH DISCHARGE: 40 minutes.    ____________________________ Belia Heman. Verdell Carmine, MD vjs:ap D: 09/28/2011 16:12:06 ET T: 09/29/2011 12:05:43 ET JOB#: [id number]  cc: Belia Heman. Verdell Carmine, MD, <Dictator> Scott C. Stoioff, MD Sandeep R. Ma Hillock, MD Henreitta Leber MD ELECTRONICALLY SIGNED 10/03/2011 12:19

## 2014-10-04 NOTE — Consult Note (Signed)
For placement of an a left ureteral stent today.  All questions were answered and she desires to proceed.  Site marked.      Electronic Signatures: Abbie Sons (MD)  (Signed on 17-Apr-13 08:08)  Authored  Last Updated: 17-Apr-13 08:08 by Abbie Sons (MD)

## 2014-10-04 NOTE — Op Note (Signed)
PATIENT NAME:  [name], [name] MR#:  [id number] DATE OF BIRTH:  Jun 18, 1926  DATE OF PROCEDURE:  11/28/2011  PREOPERATIVE DIAGNOSIS: Lymphoma.   POSTOPERATIVE DIAGNOSIS: Lymphoma.   PROCEDURE PERFORMED: Insertion right IJ Infuse-a-Port.   SURGEON: Katha Cabal, MD  SEDATION: Versed 3 mg plus fentanyl 100 mcg administered IV. Continuous ECG, pulse oximetry and cardiopulmonary monitoring is performed throughout the entire procedure by the interventional radiology nurse. Total sedation time was 45 minutes.   ACCESS: Right IJ.   FLUORO TIME: 0.5 minutes.   CONTRAST USED: Zero.    INDICATIONS: Ms. Janet Pope is an 81 year old woman who has been recently diagnosed with lymphoma and will require chemotherapy. She is therefore undergoing Infuse-a-Port placement. Risks and benefits were reviewed, questions answered, patient agrees to proceed.   DESCRIPTION OF PROCEDURE: Patient is taken to the special procedures suite, placed in the supine position with the neck extended, rotated to the left. Neck and chest wall are prepped and draped in sterile fashion. 1% lidocaine is infiltrated into the soft tissues overlying the chest wall two fingerbreadths below the clavicle as well as the base of the neck. Ultrasound is placed in a sterile sleeve. Ultrasound is utilized secondary to lack of appropriate landmarks to avoid vascular injury. Under direct ultrasound visualization, jugular vein is identified, Seldinger needle is inserted but the wire fails to feed. Therefore micropuncture kit is opened onto the field. Micropuncture needle is inserted into the jugular vein with direct ultrasound visualization after image is recorded and the jugular vein is identified with ultrasound as being echolucent and compressible indicating patency. Under fluoroscopy wire position is confirmed.   Transverse incision is created and a small pocket fashioned with blunt and sharp dissection using Metzenbaum scissors as well as  finger dissection. The catheter is then pulled subcutaneously. The micro sheath is inserted over the microwire followed by the J-wire. Dilator and peel-away sheath are then inserted. Dilator and peel-away sheath is removed and the catheter is advanced through the peel-away sheath. The peel-away sheath is removed. Under fluoroscopy, catheter is positioned with its tip in the mid atrium. It is transected and hook connected to the hub. There is slight difficulty with advancing the catheter into the hub and the catheter is repositioned and then the hub is slipped into the pocket. Catheter is then advanced completely into the venous system. The port aspirates easily, flushes well. It is then sealed into the pocket with 3-0 Vicryl followed by 4-0 Monocryl subcuticular, 4-0 Monocryl for the counterincision. Dermabond is applied. Patient tolerated procedure well and there were no immediate complications.   ____________________________ Katha Cabal, MD ggs:cms D: 11/28/2011 11:22:10 ET T: 11/28/2011 11:57:50 ET JOB#: [id number]  cc: Katha Cabal, MD, <Dictator> Sandeep R. Ma Hillock, MD Myrle Sheng Jimmye Norman, MD Katha Cabal MD ELECTRONICALLY SIGNED 12/07/2011 10:03

## 2014-10-04 NOTE — Op Note (Signed)
PATIENT NAME:  Janet Pope, SAUVE MR#:  [id number] DATE OF BIRTH:  Feb 07, 1927  DATE OF PROCEDURE:  09/27/2011  PREOPERATIVE DIAGNOSES:  1. Urinary tract infection/bacteremia.  2. Left proximal ureteral calculus.  3. Left hydronephrosis secondary to #2.   POSTOPERATIVE DIAGNOSES:    1. Urinary tract infection/bacteremia.  2. Left proximal ureteral calculus.  3. Left hydronephrosis secondary to #2.   PROCEDURES:  1. Cystoscopy with left retrograde pyelogram.  2. Placement of left ureteral stent.   SURGEON: John Giovanni, M.D.   ASSISTANT: None.   ANESTHESIA: General.   INDICATIONS: This is an 81 year old female admitted with weakness and chills. Urinalysis consistent with urinary tract infection. A noncontrast CT scan of the abdomen and pelvis remarkable for nonobstructing right lower pole calculi, left nephrolithiasis and a 6 mm left proximal ureteral calculus with mild hydronephrosis. Urine culture is growing greater than 100,000 gram-negative rods and one blood culture positive for gram-negative rods.  Placement of a left ureteral stent was recommended.   DESCRIPTION OF PROCEDURE: The patient was taken to the Operating Room where a general anesthetic was administered. She was placed in the low lithotomy position and her external genitalia were prepped and draped sterilely. Time-out was performed. A 21 French cystoscope sheath with obturator was lubricated and passed per urethra. Panendoscopy was performed which demonstrated no bladder mucosal abnormalities, erythema, or tumor. The right ureteral orifice remarkable for clear efflux. No efflux was seen from the left ureteral orifice. A 0.035 Glidewire was placed through the cystoscope and into the left ureteral orifice. Guidewire was placed up the left renal pelvis under fluoroscopic guidance without difficulty. A 5 French open-ended ureteral catheter was placed over the wire. There was some resistance at the level of the stone at the UPJ.  Once passed the stone, guidewire was removed. Retrograde pyelogram was performed which shows mild to moderate left hydronephrosis. There was no extravasation and catheter placement was correct. The guidewire was replaced and the open-ended catheter was removed. Prior to removal, the UPJ was marked at 25 cm. A 6 French/26 cm Bard inlay ureteral stent was placed. There was good curl seen in the renal pelvis under fluoroscopy. The distal end of the stent was well positioned in the bladder. There was marked efflux of cloudy urine seen after stent placement. A B and O suppository was placed per rectum. She was taken to PAC-U in stable condition. There were no complications. EBL was 0.  ____________________________ Ronda Fairly. Bernardo Heater, MD scs:ap D: 09/27/2011 15:45:17 ET T: 09/28/2011 10:03:35 ET JOB#: [id number]  cc: Nicki Reaper C. Bernardo Heater, MD, <Dictator> Abbie Sons MD ELECTRONICALLY SIGNED 10/05/2011 18:48

## 2014-10-05 DIAGNOSIS — M109 Gout, unspecified: Secondary | ICD-10-CM | POA: Insufficient documentation

## 2014-11-03 ENCOUNTER — Ambulatory Visit: Payer: PRIVATE HEALTH INSURANCE | Admitting: Oncology

## 2014-11-03 ENCOUNTER — Other Ambulatory Visit: Payer: PRIVATE HEALTH INSURANCE

## 2014-12-21 ENCOUNTER — Emergency Department: Payer: PPO

## 2014-12-21 ENCOUNTER — Encounter: Payer: Self-pay | Admitting: *Deleted

## 2014-12-21 ENCOUNTER — Other Ambulatory Visit: Payer: Self-pay

## 2014-12-21 ENCOUNTER — Emergency Department
Admission: EM | Admit: 2014-12-21 | Discharge: 2014-12-21 | Disposition: A | Discharge status: Home / Self Care | Payer: PPO | Attending: Emergency Medicine | Admitting: Emergency Medicine

## 2014-12-21 DIAGNOSIS — Y92410 Unspecified street and highway as the place of occurrence of the external cause: Secondary | ICD-10-CM | POA: Insufficient documentation

## 2014-12-21 DIAGNOSIS — I10 Essential (primary) hypertension: Secondary | ICD-10-CM | POA: Diagnosis not present

## 2014-12-21 DIAGNOSIS — Y998 Other external cause status: Secondary | ICD-10-CM | POA: Insufficient documentation

## 2014-12-21 DIAGNOSIS — Y9389 Activity, other specified: Secondary | ICD-10-CM | POA: Insufficient documentation

## 2014-12-21 DIAGNOSIS — Z79899 Other long term (current) drug therapy: Secondary | ICD-10-CM | POA: Insufficient documentation

## 2014-12-21 DIAGNOSIS — S299XXA Unspecified injury of thorax, initial encounter: Secondary | ICD-10-CM | POA: Diagnosis present

## 2014-12-21 DIAGNOSIS — S20219A Contusion of unspecified front wall of thorax, initial encounter: Secondary | ICD-10-CM

## 2014-12-21 MED ORDER — HYDROCODONE-ACETAMINOPHEN 5-325 MG PO TABS: 1.0000 | ORAL_TABLET | ORAL | Status: DC | PRN

## 2014-12-21 MED ORDER — METHOCARBAMOL 500 MG PO TABS: 500.0000 mg | ORAL_TABLET | Freq: Three times a day (TID) | ORAL | Status: DC
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
## 2015-08-08 ENCOUNTER — Emergency Department
Admission: EM | Admit: 2015-08-08 | Discharge: 2015-08-08 | Disposition: A | Discharge status: Home / Self Care | Payer: PPO | Attending: Emergency Medicine | Admitting: Emergency Medicine

## 2015-08-08 DIAGNOSIS — Z79899 Other long term (current) drug therapy: Secondary | ICD-10-CM | POA: Diagnosis not present

## 2015-08-08 DIAGNOSIS — Y998 Other external cause status: Secondary | ICD-10-CM | POA: Insufficient documentation

## 2015-08-08 DIAGNOSIS — R1030 Lower abdominal pain, unspecified: Secondary | ICD-10-CM | POA: Diagnosis not present

## 2015-08-08 DIAGNOSIS — S39012A Strain of muscle, fascia and tendon of lower back, initial encounter: Secondary | ICD-10-CM

## 2015-08-08 DIAGNOSIS — Y9389 Activity, other specified: Secondary | ICD-10-CM | POA: Insufficient documentation

## 2015-08-08 DIAGNOSIS — I10 Essential (primary) hypertension: Secondary | ICD-10-CM | POA: Diagnosis not present

## 2015-08-08 DIAGNOSIS — Y9289 Other specified places as the place of occurrence of the external cause: Secondary | ICD-10-CM | POA: Diagnosis not present

## 2015-08-08 DIAGNOSIS — X58XXXA Exposure to other specified factors, initial encounter: Secondary | ICD-10-CM | POA: Diagnosis not present

## 2015-08-08 DIAGNOSIS — S3992XA Unspecified injury of lower back, initial encounter: Secondary | ICD-10-CM | POA: Diagnosis present

## 2015-08-08 LAB — URINALYSIS COMPLETE WITH MICROSCOPIC (ARMC ONLY)
BACTERIA UA: NONE SEEN
Bilirubin Urine: NEGATIVE
Glucose, UA: NEGATIVE mg/dL
KETONES UR: NEGATIVE mg/dL
LEUKOCYTES UA: NEGATIVE
NITRITE: NEGATIVE
PROTEIN: 30 mg/dL — AB
Specific Gravity, Urine: 1.017 (ref 1.005–1.030)
pH: 6 (ref 5.0–8.0)

## 2015-08-08 MED ORDER — HYDROCODONE-ACETAMINOPHEN 5-325 MG PO TABS: 1.0000 | ORAL_TABLET | ORAL | Status: DC | PRN

## 2015-08-08 MED ORDER — DIAZEPAM 2 MG PO TABS
2.0000 mg | ORAL_TABLET | Freq: Once | ORAL | Status: AC
  Administered 2015-08-08: 2 mg via ORAL
  Filled 2015-08-08: qty 1

## 2015-08-08 MED ORDER — IBUPROFEN 600 MG PO TABS: 600.0000 mg | ORAL_TABLET | Freq: Four times a day (QID) | ORAL | Status: DC | PRN

## 2015-08-08 MED ORDER — BACLOFEN 10 MG PO TABS
10.0000 mg | ORAL_TABLET | Freq: Three times a day (TID) | ORAL | Status: DC
Start: 2015-08-08 — End: 2015-09-02

## 2015-08-08 NOTE — ED Notes (Addendum)
Pt reports lower back pain that comes and goes but started after she left the hospital visiting yesterday. Increased pain with movement. Denies urinary complaints. Denies fall or recent injury

## 2015-08-08 NOTE — ED Provider Notes (Signed)
[facility] Emergency Department Provider Note  ____________________________________________  Time seen: Approximately 8:10 AM  I have reviewed the triage vital signs and the nursing notes.   HISTORY  Chief Complaint Back Pain    HPI Janet Pope is a 82 y.o. female who presents with complaints of lower back pain since last night. Patient reports that she came to the hospital on set up here for about 7 hours and lasted her pain started hurting in her back. Patient states that she has a little bit of suprapubic tenderness and associated with burning. Denies any recent trauma. Describes her pain as a 9/10 and nonradiating except for across the back. Has not taken any medication over-the-counter for pain. Continues today. Does not appear to change with rest or movement.   Past Medical History  Diagnosis Date  . HLD (hyperlipidemia)   . GERD (gastroesophageal reflux disease)   . Kidney stone   . H/O lumpectomy   . Obstructive sleep apnea   . Gout   . Diverticulosis   . Nephrolithiasis     history of right lithotripsy  . History of valvular heart disease   . HTN (hypertension)   . Breast cancer   . CLL (chronic lymphocytic leukemia)   . Stomach cancer   . Gout   . CHF (congestive heart failure)   . Arthritis     Patient Active Problem List   Diagnosis Date Noted  . Gout   . Mitral stenosis 10/03/2012  . Palpitations 10/03/2012  . Dyspnea 08/02/2012  . HTN (hypertension)     Past Surgical History  Procedure Laterality Date  . Total abdominal hysterectomy w/ bilateral salpingoophorectomy    . Breast lumpectomy    . Partial hip arthroplasty      right  . Cataract extraction, bilateral    . Lithotripsy      right  . Portacath placement    . Abdominal hysterectomy      Current Outpatient Rx  Name  Route  Sig  Dispense  Refill  . amLODipine (NORVASC) 2.5 MG tablet   Oral   Take 2.5 mg by mouth daily.         . baclofen (LIORESAL)  10 MG tablet   Oral   Take 1 tablet (10 mg total) by mouth 3 (three) times daily.   30 tablet   0   . Bisacodyl (DULCOLAX PO)   Oral   Take by mouth as needed.         . carvedilol (COREG) 25 MG tablet   Oral   Take 25 mg by mouth 2 (two) times daily with a meal.          . furosemide (LASIX) 20 MG tablet   Oral   Take 20 mg by mouth as needed.         Marland Kitchen HYDROcodone-acetaminophen (NORCO) 5-325 MG tablet   Oral   Take 1-2 tablets by mouth every 4 (four) hours as needed for moderate pain.   15 tablet   0   . ibuprofen (ADVIL,MOTRIN) 600 MG tablet   Oral   Take 1 tablet (600 mg total) by mouth every 6 (six) hours as needed.   30 tablet   0   . Ipratropium-Albuterol (COMBIVENT IN)   Inhalation   Inhale into the lungs as needed.         . latanoprost (XALATAN) 0.005 % ophthalmic solution      1 drop at bedtime.         Marland Kitchen  omeprazole (PRILOSEC) 20 MG capsule   Oral   Take 20 mg by mouth daily.         Marland Kitchen oxybutynin (DITROPAN) 5 MG tablet   Oral   Take 5 mg by mouth 2 (two) times daily.         . simvastatin (ZOCOR) 20 MG tablet   Oral   Take 20 mg by mouth daily.         . timolol (BETIMOL) 0.5 % ophthalmic solution   Both Eyes   Place 1 drop into both eyes as needed.           Allergies Ace inhibitors; Aspirin; Eggs or egg-derived products; and Milk-related compounds  Family History  Problem Relation Age of Onset  . Hypertension Sister     Social History Social History  Substance Use Topics  . Smoking status: Never Smoker   . Smokeless tobacco: Not on file  . Alcohol Use: No    Review of Systems Constitutional: No fever/chills Eyes: No visual changes. ENT: No sore throat. Cardiovascular: Denies chest pain. Respiratory: Denies shortness of breath. Gastrointestinal: No abdominal pain.  No nausea, no vomiting.  No diarrhea.  No constipation. Positive for suprapubic tenderness. Genitourinary: Negative for dysuria. Musculoskeletal:  Positive for low back pain Skin: Negative for rash. Neurological: Negative for headaches, focal weakness or numbness.  10-point ROS otherwise negative.  ____________________________________________   PHYSICAL EXAM:  VITAL SIGNS: ED Triage Vitals  Enc Vitals Group     BP 08/08/15 0805 141/64 mmHg     Pulse Rate 08/08/15 0805 81     Resp 08/08/15 0805 18     Temp 08/08/15 0805 98.4 F (36.9 C)     Temp Source 08/08/15 0805 Oral     SpO2 08/08/15 0805 100 %     Weight 08/08/15 0805 190 lb (86.183 kg)     Height 08/08/15 0805 5\' 2"  (1.575 m)     Head Cir --      Peak Flow --      Pain Score 08/08/15 0806 9     Pain Loc --      Pain Edu? --      Excl. in [place]? --     Constitutional: Alert and oriented. Well appearing and in no acute distress.  Neck: No stridor. No JVD noted  Cardiovascular: Normal rate, regular rhythm. Grossly normal heart sounds.  Good peripheral circulation. Respiratory: Normal respiratory effort.  No retractions. Lungs CTAB. Gastrointestinal: Soft and nontender. No distention. No abdominal bruits. Positive CVA tenderness. Mild suprapubic tenderness. Musculoskeletal: No lower extremity tenderness nor edema.  No joint effusions. No spinal tenderness. Neurologic:  Normal speech and language. No gross focal neurologic deficits are appreciated. No gait instability. Skin:  Skin is warm, dry and intact. No rash noted. Psychiatric: Mood and affect are normal. Speech and behavior are normal.  ____________________________________________   LABS (all labs ordered are listed, but only abnormal results are displayed)  Labs Reviewed  URINALYSIS COMPLETEWITH MICROSCOPIC ([place]) - Abnormal; Notable for the following:    Color, Urine AMBER (*)    APPearance CLEAR (*)    Hgb urine dipstick 1+ (*)    Protein, ur 30 (*)    Squamous Epithelial / LPF 0-5 (*)    All other components within normal limits      RADIOLOGY   ____________________________________________   PROCEDURES  Procedure(s) performed: None  Critical Care performed: No  ____________________________________________   INITIAL IMPRESSION / ASSESSMENT AND PLAN / ED  COURSE  Pertinent labs & imaging results that were available during my care of the patient were reviewed by me and considered in my medical decision making (see chart for details).  Acute lumbar strain. Rx given for baclofen 10 mg 3 times a day, ibuprofen 600 mg and Vicodin 5/325. Patient follow-up PCP or return to ER with any worsening symptomology.  Patient voices no other emergency medical complaints at this time and will return as needed. ____________________________________________   FINAL CLINICAL IMPRESSION(S) / ED DIAGNOSES  Final diagnoses:  Lumbar strain, initial encounter     This chart was dictated using voice recognition software/Dragon. Despite best efforts to proofread, errors can occur which can change the meaning. Any change was purely unintentional.   Arlyss Repress, PA-C 08/08/15 1012  Lavonia Drafts, MD 08/08/15 [phone]

## 2015-08-08 NOTE — Discharge Instructions (Signed)
Gluteal Strain A gluteal strain happens when the muscles in the buttocks (gluteal muscles) are overstretched or torn. A tear can be partial or complete. A gluteal strain can cause pain and stiffness in your buttocks, legs, and lower back. A strain might be referred to as "pulling a muscle." The severity of a gluteal strain is rated in degrees. First-degree strains have the least amount of muscle tearing and pain. Second-degree and third-degrees strains have increasingly more tearing and pain.  CAUSES  There are many possible causes of gluteal strain, such as:   Stretching the muscles too far.   Putting too much stress on the muscles before they are warmed up.   Overusing the muscles.   Repetitive muscle movements over long periods of time.   Injury.  RISK FACTORS This condition is more likely to develop in:   People who are in cold weather.   People who are physically tired.  People with poor strength and flexibility.   People who do not warm up properly before physical activity.   People who do exercises or play sports with sudden bursts of activity.  People who have poor exercise technique. SYMPTOMS  Symptoms of this condition include:  Pain in the buttocks, especially when moving the legs. Pain may spread to the lower back or the legs.  Bruising and swelling of the buttocks.  Tenderness, weakness, or stiffness in the buttocks.  Muscle spasms. DIAGNOSIS  This condition is diagnosed based on a physical exam and medical history. Your health care provider may do some range of motion exercises with you. You may have tests, such as MRI or X-rays.  Your strain may be rated based on how severe it is. The ratings are:   Grade 1 strain (mild). Your muscles are overstretched. You might have very small muscle tears. This type of strain generally heals in about 1 week.   Grade 2 strain (moderate). Your muscles are partially torn. This may take 1 to 2 months to  heal.  Grade 3 strain (severe). Your muscles are completely torn. A severe strain can take more than three months to heal. Grade 3 gluteal strains are rare. TREATMENT  Treatment for this condition includes resting, icing, and raising (elevating) the injured area as much as possible. Your health care provider may recommend over-the-counter pain medicines. If your gluteal strain is severe or very painful, your health care provider may prescribe pain medicines or physical therapy. Surgery for severe strains is rare. HOME CARE INSTRUCTIONS   Take over-the-counter and prescription medicines only as told by your health care provider.  Do not drive or operate heavy machinery while taking prescription pain medicine.  Return to your normal activities as told by your health care provider. Ask your health care provider what activities are safe for you.  Rest your gluteal muscles as much as possible, especially for the first 2-3 days.  Begin exercising or stretching as told by your health care provider.  If directed, apply ice to the injured area:  Put ice in a plastic bag.  Place a towel between your skin and the bag.  Leave the ice on for 20 minutes, 2-3 times per day.  Keep all follow-up visits as told by your health care provider. This is important. PREVENTION   Warm up and stretch before physical activity.   Stretch after physical activity.   Learn and use correct techniques for exercising and playing sports.  Avoid difficult physical activities if your muscles are tired or sore.  Strengthen your gluteal muscles and the surrounding muscles.   Develop your flexibility by stretching at least once a day.  SEEK MEDICAL CARE IF:   You have pain or swelling that gets worse or does not get better with medicine.   You have "shooting" pain that moves through your leg.  SEEK IMMEDIATE MEDICAL CARE IF:  You develop weakness in part of your body.   You lose feeling in part of  your body.  You have severe pain.   You are unable to walk.  You have difficulty controlling your bladder or bowel movements.   This information is not intended to replace advice given to you by your health care provider. Make sure you discuss any questions you have with your health care provider.   Document Released: 03/26/2009 Document Revised: 02/17/2015 Document Reviewed: 10/14/2014 Elsevier Interactive Patient Education 2016 [employer].  Muscle Strain A muscle strain is an injury that occurs when a muscle is stretched beyond its normal length. Usually a small number of muscle fibers are torn when this happens. Muscle strain is rated in degrees. First-degree strains have the least amount of muscle fiber tearing and pain. Second-degree and third-degree strains have increasingly more tearing and pain.  Usually, recovery from muscle strain takes 1-2 weeks. Complete healing takes 5-6 weeks.  CAUSES  Muscle strain happens when a sudden, violent force placed on a muscle stretches it too far. This may occur with lifting, sports, or a fall.  RISK FACTORS Muscle strain is especially common in athletes.  SIGNS AND SYMPTOMS At the site of the muscle strain, there may be:  Pain.  Bruising.  Swelling.  Difficulty using the muscle due to pain or lack of normal function. DIAGNOSIS  Your health care provider will perform a physical exam and ask about your medical history. TREATMENT  Often, the best treatment for a muscle strain is resting, icing, and applying cold compresses to the injured area.  HOME CARE INSTRUCTIONS   Use the PRICE method of treatment to promote muscle healing during the first 2-3 days after your injury. The PRICE method involves:  Protecting the muscle from being injured again.  Restricting your activity and resting the injured body part.  Icing your injury. To do this, put ice in a plastic bag. Place a towel between your skin and the bag. Then, apply the ice  and leave it on from 15-20 minutes each hour. After the third day, switch to moist heat packs.  Apply compression to the injured area with a splint or elastic bandage. Be careful not to wrap it too tightly. This may interfere with blood circulation or increase swelling.  Elevate the injured body part above the level of your heart as often as you can.  Only take over-the-counter or prescription medicines for pain, discomfort, or fever as directed by your health care provider.  Warming up prior to exercise helps to prevent future muscle strains. SEEK MEDICAL CARE IF:   You have increasing pain or swelling in the injured area.  You have numbness, tingling, or a significant loss of strength in the injured area. MAKE SURE YOU:   Understand these instructions.  Will watch your condition.  Will get help right away if you are not doing well or get worse.   This information is not intended to replace advice given to you by your health care provider. Make sure you discuss any questions you have with your health care provider.   Document Released: 05/29/2005 Document Revised: 03/19/2013  Document Reviewed: 12/26/2012 Elsevier Interactive Patient Education [employer].

## 2015-08-08 NOTE — ED Notes (Signed)
E signature pad not working 

## 2015-08-21 ENCOUNTER — Emergency Department: Payer: PPO

## 2015-08-21 ENCOUNTER — Emergency Department
Admission: EM | Admit: 2015-08-21 | Discharge: 2015-08-21 | Disposition: A | Discharge status: Home / Self Care | Payer: PPO | Source: Home / Self Care | Attending: Emergency Medicine | Admitting: Emergency Medicine

## 2015-08-21 ENCOUNTER — Encounter: Payer: Self-pay | Admitting: Emergency Medicine

## 2015-08-21 DIAGNOSIS — N179 Acute kidney failure, unspecified: Secondary | ICD-10-CM | POA: Diagnosis not present

## 2015-08-21 DIAGNOSIS — C9110 Chronic lymphocytic leukemia of B-cell type not having achieved remission: Secondary | ICD-10-CM | POA: Insufficient documentation

## 2015-08-21 DIAGNOSIS — Z79899 Other long term (current) drug therapy: Secondary | ICD-10-CM | POA: Insufficient documentation

## 2015-08-21 DIAGNOSIS — Z91011 Allergy to milk products: Secondary | ICD-10-CM

## 2015-08-21 DIAGNOSIS — I050 Rheumatic mitral stenosis: Secondary | ICD-10-CM | POA: Diagnosis present

## 2015-08-21 DIAGNOSIS — Z853 Personal history of malignant neoplasm of breast: Secondary | ICD-10-CM

## 2015-08-21 DIAGNOSIS — E785 Hyperlipidemia, unspecified: Secondary | ICD-10-CM | POA: Diagnosis present

## 2015-08-21 DIAGNOSIS — G4733 Obstructive sleep apnea (adult) (pediatric): Secondary | ICD-10-CM | POA: Diagnosis present

## 2015-08-21 DIAGNOSIS — D649 Anemia, unspecified: Secondary | ICD-10-CM | POA: Insufficient documentation

## 2015-08-21 DIAGNOSIS — Z886 Allergy status to analgesic agent status: Secondary | ICD-10-CM

## 2015-08-21 DIAGNOSIS — Z85028 Personal history of other malignant neoplasm of stomach: Secondary | ICD-10-CM

## 2015-08-21 DIAGNOSIS — R4182 Altered mental status, unspecified: Secondary | ICD-10-CM | POA: Diagnosis present

## 2015-08-21 DIAGNOSIS — M1990 Unspecified osteoarthritis, unspecified site: Secondary | ICD-10-CM | POA: Diagnosis present

## 2015-08-21 DIAGNOSIS — I110 Hypertensive heart disease with heart failure: Secondary | ICD-10-CM | POA: Diagnosis present

## 2015-08-21 DIAGNOSIS — I639 Cerebral infarction, unspecified: Secondary | ICD-10-CM | POA: Diagnosis not present

## 2015-08-21 DIAGNOSIS — I509 Heart failure, unspecified: Secondary | ICD-10-CM

## 2015-08-21 DIAGNOSIS — Z856 Personal history of leukemia: Secondary | ICD-10-CM

## 2015-08-21 DIAGNOSIS — R509 Fever, unspecified: Secondary | ICD-10-CM

## 2015-08-21 DIAGNOSIS — M109 Gout, unspecified: Secondary | ICD-10-CM | POA: Diagnosis present

## 2015-08-21 DIAGNOSIS — Z91012 Allergy to eggs: Secondary | ICD-10-CM

## 2015-08-21 DIAGNOSIS — K219 Gastro-esophageal reflux disease without esophagitis: Secondary | ICD-10-CM | POA: Diagnosis present

## 2015-08-21 DIAGNOSIS — I959 Hypotension, unspecified: Secondary | ICD-10-CM | POA: Diagnosis present

## 2015-08-21 DIAGNOSIS — R059 Cough, unspecified: Secondary | ICD-10-CM

## 2015-08-21 DIAGNOSIS — Z96641 Presence of right artificial hip joint: Secondary | ICD-10-CM | POA: Diagnosis present

## 2015-08-21 DIAGNOSIS — R05 Cough: Secondary | ICD-10-CM

## 2015-08-21 DIAGNOSIS — E860 Dehydration: Secondary | ICD-10-CM | POA: Diagnosis present

## 2015-08-21 DIAGNOSIS — R4701 Aphasia: Secondary | ICD-10-CM | POA: Diagnosis present

## 2015-08-21 LAB — CBC WITH DIFFERENTIAL/PLATELET
Basophils Absolute: 0 10*3/uL (ref 0–0.1)
Basophils Relative: 0 %
EOS ABS: 0.1 10*3/uL (ref 0–0.7)
Eosinophils Relative: 1 %
HEMATOCRIT: 24 % — AB (ref 35.0–47.0)
HEMOGLOBIN: 7.9 g/dL — AB (ref 12.0–16.0)
LYMPHS ABS: 0.5 10*3/uL — AB (ref 1.0–3.6)
Lymphocytes Relative: 5 %
MCH: 25.2 pg — AB (ref 26.0–34.0)
MCHC: 33 g/dL (ref 32.0–36.0)
MCV: 76.4 fL — ABNORMAL LOW (ref 80.0–100.0)
MONOS PCT: 12 %
Monocytes Absolute: 1.3 10*3/uL — ABNORMAL HIGH (ref 0.2–0.9)
NEUTROS ABS: 8.8 10*3/uL — AB (ref 1.4–6.5)
NEUTROS PCT: 82 %
Platelets: 256 10*3/uL (ref 150–440)
RBC: 3.15 MIL/uL — ABNORMAL LOW (ref 3.80–5.20)
RDW: 17.5 % — ABNORMAL HIGH (ref 11.5–14.5)
WBC: 10.6 10*3/uL (ref 3.6–11.0)

## 2015-08-21 LAB — RAPID INFLUENZA A&B ANTIGENS (ARMC ONLY): INFLUENZA B (ARMC): NEGATIVE

## 2015-08-21 LAB — COMPREHENSIVE METABOLIC PANEL
ALK PHOS: 100 U/L (ref 38–126)
ALT: 17 U/L (ref 14–54)
ANION GAP: 7 (ref 5–15)
AST: 21 U/L (ref 15–41)
Albumin: 2.7 g/dL — ABNORMAL LOW (ref 3.5–5.0)
BILIRUBIN TOTAL: 1.3 mg/dL — AB (ref 0.3–1.2)
BUN: 10 mg/dL (ref 6–20)
CALCIUM: 9.2 mg/dL (ref 8.9–10.3)
CO2: 21 mmol/L — ABNORMAL LOW (ref 22–32)
Chloride: 107 mmol/L (ref 101–111)
Creatinine, Ser: 0.83 mg/dL (ref 0.44–1.00)
Glucose, Bld: 93 mg/dL (ref 65–99)
Potassium: 3.9 mmol/L (ref 3.5–5.1)
Sodium: 135 mmol/L (ref 135–145)
TOTAL PROTEIN: 7.1 g/dL (ref 6.5–8.1)

## 2015-08-21 LAB — LACTIC ACID, PLASMA: LACTIC ACID, VENOUS: 1.3 mmol/L (ref 0.5–2.0)

## 2015-08-21 LAB — RAPID INFLUENZA A&B ANTIGENS: Influenza A (ARMC): NEGATIVE

## 2015-08-21 MED ORDER — HYDROCOD POLST-CPM POLST ER 10-8 MG/5ML PO SUER: 5.0000 mL | Freq: Two times a day (BID) | ORAL | Status: DC

## 2015-08-21 MED ORDER — HYDROCOD POLST-CPM POLST ER 10-8 MG/5ML PO SUER
5.0000 mL | Freq: Once | ORAL | Status: AC
  Administered 2015-08-21: 5 mL via ORAL
  Filled 2015-08-21: qty 5

## 2015-08-21 MED ORDER — LEVOFLOXACIN 750 MG PO TABS: 750.0000 mg | ORAL_TABLET | Freq: Every day | ORAL | Status: DC

## 2015-08-21 MED ORDER — ACETAMINOPHEN 500 MG PO TABS
1000.0000 mg | ORAL_TABLET | Freq: Once | ORAL | Status: AC
  Administered 2015-08-21: 1000 mg via ORAL
  Filled 2015-08-21: qty 2

## 2015-08-21 MED ORDER — LEVOFLOXACIN 750 MG PO TABS
750.0000 mg | ORAL_TABLET | Freq: Once | ORAL | Status: AC
  Administered 2015-08-21: 750 mg via ORAL
  Filled 2015-08-21: qty 1

## 2015-08-21 NOTE — Discharge Instructions (Signed)
Cough, Adult Coughing is a reflex that clears your throat and your airways. Coughing helps to heal and protect your lungs. It is normal to cough occasionally, but a cough that happens with other symptoms or lasts a long time may be a sign of a condition that needs treatment. A cough may last only 2-3 weeks (acute), or it may last longer than 8 weeks (chronic). CAUSES Coughing is commonly caused by:  Breathing in substances that irritate your lungs.  A viral or bacterial respiratory infection.  Allergies.  Asthma.  Postnasal drip.  Smoking.  Acid backing up from the stomach into the esophagus (gastroesophageal reflux).  Certain medicines.  Chronic lung problems, including COPD (or rarely, lung cancer).  Other medical conditions such as heart failure. HOME CARE INSTRUCTIONS  Pay attention to any changes in your symptoms. Take these actions to help with your discomfort:  Take medicines only as told by your health care provider.  If you were prescribed an antibiotic medicine, take it as told by your health care provider. Do not stop taking the antibiotic even if you start to feel better.  Talk with your health care provider before you take a cough suppressant medicine.  Drink enough fluid to keep your urine clear or pale yellow.  If the air is dry, use a cold steam vaporizer or humidifier in your bedroom or your home to help loosen secretions.  Avoid anything that causes you to cough at work or at home.  If your cough is worse at night, try sleeping in a semi-upright position.  Avoid cigarette smoke. If you smoke, quit smoking. If you need help quitting, ask your health care provider.  Avoid caffeine.  Avoid alcohol.  Rest as needed. SEEK MEDICAL CARE IF:   You have new symptoms.  You cough up pus.  Your cough does not get better after 2-3 weeks, or your cough gets worse.  You cannot control your cough with suppressant medicines and you are losing sleep.  You  develop pain that is getting worse or pain that is not controlled with pain medicines.  You have a fever.  You have unexplained weight loss.  You have night sweats. SEEK IMMEDIATE MEDICAL CARE IF:  You cough up blood.  You have difficulty breathing.  Your heartbeat is very fast.   This information is not intended to replace advice given to you by your health care provider. Make sure you discuss any questions you have with your health care provider.   Document Released: 11/25/2010 Document Revised: 02/17/2015 Document Reviewed: 08/05/2014 Elsevier Interactive Patient Education 2016 [place].  Fever, Adult A fever is an increase in the body's temperature. It is usually defined as a temperature of 100F (38C) or higher. Brief mild or moderate fevers generally have no long-term effects, and they often do not require treatment. Moderate or high fevers may make you feel uncomfortable and can sometimes be a sign of a serious illness or disease. The sweating that may occur with repeated or prolonged fever may also cause dehydration. Fever is confirmed by taking a temperature with a thermometer. A measured temperature can vary with:  Age.  Time of day.  Location of the thermometer:  Mouth (oral).  Rectum (rectal).  Ear (tympanic).  Underarm (axillary).  Forehead (temporal). HOME CARE INSTRUCTIONS Pay attention to any changes in your symptoms. Take these actions to help with your condition:  Take over-the counter and prescription medicines only as told by your health care provider. Follow the dosing instructions  carefully.  If you were prescribed an antibiotic medicine, take it as told by your health care provider. Do not stop taking the antibiotic even if you start to feel better.  Rest as needed.  Drink enough fluid to keep your urine clear or pale yellow. This helps to prevent dehydration.  Sponge yourself or bathe with room-temperature water to help reduce your body  temperature as needed. Do not use ice water.  Do not overbundle yourself in blankets or heavy clothes. SEEK MEDICAL CARE IF:  You vomit.  You cannot eat or drink without vomiting.  You have diarrhea.  You have pain when you urinate.  Your symptoms do not improve with treatment.  You develop new symptoms.  You develop excessive weakness. SEEK IMMEDIATE MEDICAL CARE IF:  You have shortness of breath or have trouble breathing.  You are dizzy or you faint.  You are disoriented or confused.  You develop signs of dehydration, such as a dry mouth, decreased urination, or paleness.  You develop severe pain in your abdomen.  You have persistent vomiting or diarrhea.  You develop a skin rash.  Your symptoms suddenly get worse.   This information is not intended to replace advice given to you by your health care provider. Make sure you discuss any questions you have with your health care provider.   Document Released: 11/22/2000 Document Revised: 02/17/2015 Document Reviewed: 07/23/2014 Elsevier Interactive Patient Education [employer].

## 2015-08-21 NOTE — ED Notes (Addendum)
Patient's temperature elevated. MD made aware. Will medicate patient and continue to monitor. Recheck temp at 2300. Family at bedside. OK with MD to D/C to home if temp is reduced.

## 2015-08-21 NOTE — ED Notes (Signed)
Pt with temp of 99.8 orally, md Greaves notified. Order for discharge received.

## 2015-08-21 NOTE — ED Provider Notes (Signed)
[facility] Emergency Department Provider Note     Time seen: ----------------------------------------- 5:50 PM on 08/21/2015 -----------------------------------------    I have reviewed the triage vital signs and the nursing notes.   HISTORY  Chief Complaint Cough    HPI Janet Pope is a 82 y.o. female who presents to ER for cough and fever since this morning. Family reports may be cough started yesterday, she's had posttussive emesis. She denies any chills or body aches, denies any diarrhea or abdominal pain. Patient denies recent history of same, states the cough is nonproductive.   Past Medical History  Diagnosis Date  . HLD (hyperlipidemia)   . GERD (gastroesophageal reflux disease)   . Kidney stone   . H/O lumpectomy   . Obstructive sleep apnea   . Gout   . Diverticulosis   . Nephrolithiasis     history of right lithotripsy  . History of valvular heart disease   . HTN (hypertension)   . Breast cancer ([place])   . CLL (chronic lymphocytic leukemia) ([place])   . Stomach cancer ([place])   . Gout   . CHF (congestive heart failure) ([place])   . Arthritis     Patient Active Problem List   Diagnosis Date Noted  . Gout   . Mitral stenosis 10/03/2012  . Palpitations 10/03/2012  . Dyspnea 08/02/2012  . HTN (hypertension)     Past Surgical History  Procedure Laterality Date  . Total abdominal hysterectomy w/ bilateral salpingoophorectomy    . Breast lumpectomy    . Partial hip arthroplasty      right  . Cataract extraction, bilateral    . Lithotripsy      right  . Portacath placement    . Abdominal hysterectomy      Allergies Ace inhibitors; Aspirin; Eggs or egg-derived products; and Milk-related compounds  Social History Social History  Substance Use Topics  . Smoking status: Never Smoker   . Smokeless tobacco: None  . Alcohol Use: No    Review of Systems Constitutional: Positive for fever Eyes: Negative for visual  changes. ENT: Negative for sore throat. Cardiovascular: Negative for chest pain. Respiratory: Negative for shortness of breath. Positive for cough Gastrointestinal: Negative for abdominal pain, positive for vomiting with cough Genitourinary: Negative for dysuria. Musculoskeletal: Negative for back pain. Skin: Negative for rash. Neurological: Negative for headaches, focal weakness or numbness.  10-point ROS otherwise negative.  ____________________________________________   PHYSICAL EXAM:  VITAL SIGNS: ED Triage Vitals  Enc Vitals Group     BP 08/21/15 1732 169/74 mmHg     Pulse Rate 08/21/15 1732 95     Resp 08/21/15 1732 20     Temp 08/21/15 1732 101 F (38.3 C)     Temp Source 08/21/15 1732 Oral     SpO2 08/21/15 1732 100 %     Weight 08/21/15 1732 182 lb (82.555 kg)     Height 08/21/15 1732 5\' 2"  (1.575 m)     Head Cir --      Peak Flow --      Pain Score 08/21/15 1735 0     Pain Loc --      Pain Edu? --      Excl. in [place]? --     Constitutional: Alert and oriented. Well appearing and in no distress. Eyes: Conjunctivae are normal. PERRL. Normal extraocular movements. ENT   Head: Normocephalic and atraumatic.   Nose: No congestion/rhinnorhea.   Mouth/Throat: Mucous membranes are moist.   Neck: No  stridor. Cardiovascular: Normal rate, regular rhythm. Normal and symmetric distal pulses are present in all extremities. No murmurs, rubs, or gallops. Respiratory: Normal respiratory effort without tachypnea nor retractions. Breath sounds are grossly clear bilaterally. Diminished breath sounds in the right base Gastrointestinal: Soft and nontender. No distention. No abdominal bruits.  Musculoskeletal: Nontender with normal range of motion in all extremities. No joint effusions.  No lower extremity tenderness nor edema. Neurologic:  Normal speech and language. No gross focal neurologic deficits are appreciated.  Skin:  Skin is warm, dry and intact. No rash  noted. Psychiatric: Mood and affect are normal. Speech and behavior are normal. Patient exhibits appropriate insight and judgment. ____________________________________________  ED COURSE:  Pertinent labs & imaging results that were available during my care of the patient were reviewed by me and considered in my medical decision making (see chart for details). Patient is no acute distress, complaining only of cough. They were unaware she developed a fever. I will check basic labs and reevaluate. ____________________________________________    LABS (pertinent positives/negatives)  Labs Reviewed  CBC WITH DIFFERENTIAL/PLATELET - Abnormal; Notable for the following:    RBC 3.15 (*)    Hemoglobin 7.9 (*)    HCT 24.0 (*)    MCV 76.4 (*)    MCH 25.2 (*)    RDW 17.5 (*)    Neutro Abs 8.8 (*)    Lymphs Abs 0.5 (*)    Monocytes Absolute 1.3 (*)    All other components within normal limits  COMPREHENSIVE METABOLIC PANEL - Abnormal; Notable for the following:    CO2 21 (*)    Albumin 2.7 (*)    Total Bilirubin 1.3 (*)    All other components within normal limits  RAPID INFLUENZA A&B ANTIGENS ([facility] ONLY)  CULTURE, BLOOD (ROUTINE X 2)  CULTURE, BLOOD (ROUTINE X 2)  LACTIC ACID, PLASMA  URINALYSIS COMPLETEWITH MICROSCOPIC ([facility] ONLY)  LACTIC ACID, PLASMA    RADIOLOGY Images were viewed by me  Chest x-ray  IMPRESSION: Stable. No acute findings.  ____________________________________________  FINAL ASSESSMENT AND PLAN  Fever, cough, Anemia  Plan: Patient with labs and imaging as dictated above. Labs in terms of her white blood cell count and lactic acid are reassuring. Blood cultures have been sent. She's been started on Levaquin and given Tussionex. I suspect she has a chronic anemia, they have not noted any blood in her stool. I have advised close follow-up with her doctor for this. She presents for fever and cough, chest x-ray is negative. She'll continue Tussionex and  Levaquin as an outpatient. Family is agreeable to plan.   Earleen Newport, MD   Earleen Newport, MD 08/21/15 [phone]

## 2015-08-21 NOTE — ED Notes (Signed)
Cough since yesterday.   

## 2015-08-24 ENCOUNTER — Encounter: Payer: Self-pay | Admitting: Emergency Medicine

## 2015-08-24 ENCOUNTER — Inpatient Hospital Stay
Admission: EM | Admit: 2015-08-24 | Discharge: 2015-08-27 | DRG: 683 | Disposition: A | Discharge status: Skilled Nursing Facility | Payer: PPO | Attending: Internal Medicine | Admitting: Internal Medicine

## 2015-08-24 ENCOUNTER — Emergency Department: Payer: PPO

## 2015-08-24 ENCOUNTER — Inpatient Hospital Stay (HOSPITAL_COMMUNITY)
Admit: 2015-08-24 | Discharge: 2015-08-24 | Disposition: A | Discharge status: Home / Self Care | Payer: PPO | Attending: Internal Medicine | Admitting: Internal Medicine

## 2015-08-24 DIAGNOSIS — G459 Transient cerebral ischemic attack, unspecified: Secondary | ICD-10-CM

## 2015-08-24 DIAGNOSIS — Z85028 Personal history of other malignant neoplasm of stomach: Secondary | ICD-10-CM | POA: Diagnosis not present

## 2015-08-24 DIAGNOSIS — Z853 Personal history of malignant neoplasm of breast: Secondary | ICD-10-CM | POA: Diagnosis not present

## 2015-08-24 DIAGNOSIS — N179 Acute kidney failure, unspecified: Secondary | ICD-10-CM | POA: Diagnosis present

## 2015-08-24 DIAGNOSIS — Z96641 Presence of right artificial hip joint: Secondary | ICD-10-CM | POA: Diagnosis present

## 2015-08-24 DIAGNOSIS — D649 Anemia, unspecified: Secondary | ICD-10-CM | POA: Diagnosis present

## 2015-08-24 DIAGNOSIS — G4733 Obstructive sleep apnea (adult) (pediatric): Secondary | ICD-10-CM | POA: Diagnosis present

## 2015-08-24 DIAGNOSIS — M1990 Unspecified osteoarthritis, unspecified site: Secondary | ICD-10-CM | POA: Diagnosis present

## 2015-08-24 DIAGNOSIS — I639 Cerebral infarction, unspecified: Secondary | ICD-10-CM

## 2015-08-24 DIAGNOSIS — R4182 Altered mental status, unspecified: Secondary | ICD-10-CM | POA: Diagnosis present

## 2015-08-24 DIAGNOSIS — R4701 Aphasia: Secondary | ICD-10-CM | POA: Diagnosis present

## 2015-08-24 DIAGNOSIS — I110 Hypertensive heart disease with heart failure: Secondary | ICD-10-CM | POA: Diagnosis present

## 2015-08-24 DIAGNOSIS — Z856 Personal history of leukemia: Secondary | ICD-10-CM | POA: Diagnosis not present

## 2015-08-24 DIAGNOSIS — K219 Gastro-esophageal reflux disease without esophagitis: Secondary | ICD-10-CM | POA: Diagnosis present

## 2015-08-24 DIAGNOSIS — M109 Gout, unspecified: Secondary | ICD-10-CM | POA: Diagnosis present

## 2015-08-24 DIAGNOSIS — Z91011 Allergy to milk products: Secondary | ICD-10-CM | POA: Diagnosis not present

## 2015-08-24 DIAGNOSIS — E785 Hyperlipidemia, unspecified: Secondary | ICD-10-CM | POA: Diagnosis present

## 2015-08-24 DIAGNOSIS — I959 Hypotension, unspecified: Secondary | ICD-10-CM | POA: Diagnosis present

## 2015-08-24 DIAGNOSIS — E860 Dehydration: Secondary | ICD-10-CM | POA: Diagnosis present

## 2015-08-24 DIAGNOSIS — Z91012 Allergy to eggs: Secondary | ICD-10-CM | POA: Diagnosis not present

## 2015-08-24 DIAGNOSIS — R4781 Slurred speech: Secondary | ICD-10-CM | POA: Diagnosis not present

## 2015-08-24 DIAGNOSIS — Z886 Allergy status to analgesic agent status: Secondary | ICD-10-CM | POA: Diagnosis not present

## 2015-08-24 DIAGNOSIS — I509 Heart failure, unspecified: Secondary | ICD-10-CM | POA: Diagnosis present

## 2015-08-24 DIAGNOSIS — I050 Rheumatic mitral stenosis: Secondary | ICD-10-CM | POA: Diagnosis present

## 2015-08-24 DIAGNOSIS — Z1389 Encounter for screening for other disorder: Secondary | ICD-10-CM

## 2015-08-24 LAB — COMPREHENSIVE METABOLIC PANEL
ALBUMIN: 2.3 g/dL — AB (ref 3.5–5.0)
ALK PHOS: 94 U/L (ref 38–126)
ALT: 11 U/L — AB (ref 14–54)
ANION GAP: 8 (ref 5–15)
AST: 10 U/L — AB (ref 15–41)
BILIRUBIN TOTAL: 0.9 mg/dL (ref 0.3–1.2)
BUN: 35 mg/dL — AB (ref 6–20)
CALCIUM: 10.1 mg/dL (ref 8.9–10.3)
CO2: 25 mmol/L (ref 22–32)
CREATININE: 2.04 mg/dL — AB (ref 0.44–1.00)
Chloride: 102 mmol/L (ref 101–111)
GFR calc Af Amer: 24 mL/min — ABNORMAL LOW (ref >60)
GFR calc non Af Amer: 21 mL/min — ABNORMAL LOW (ref >60)
GLUCOSE: 114 mg/dL — AB (ref 65–99)
Potassium: 3.4 mmol/L — ABNORMAL LOW (ref 3.5–5.1)
Sodium: 135 mmol/L (ref 135–145)
TOTAL PROTEIN: 6.8 g/dL (ref 6.5–8.1)

## 2015-08-24 LAB — URINALYSIS COMPLETE WITH MICROSCOPIC (ARMC ONLY)
Bilirubin Urine: NEGATIVE
Glucose, UA: NEGATIVE mg/dL
KETONES UR: NEGATIVE mg/dL
Nitrite: NEGATIVE
PROTEIN: 30 mg/dL — AB
SPECIFIC GRAVITY, URINE: 1.016 (ref 1.005–1.030)
pH: 5 (ref 5.0–8.0)

## 2015-08-24 LAB — CBC
HEMATOCRIT: 22.8 % — AB (ref 35.0–47.0)
HEMOGLOBIN: 7.2 g/dL — AB (ref 12.0–16.0)
MCH: 23.9 pg — ABNORMAL LOW (ref 26.0–34.0)
MCHC: 31.8 g/dL — AB (ref 32.0–36.0)
MCV: 75.1 fL — ABNORMAL LOW (ref 80.0–100.0)
Platelets: 309 10*3/uL (ref 150–440)
RBC: 3.03 MIL/uL — AB (ref 3.80–5.20)
RDW: 18.3 % — ABNORMAL HIGH (ref 11.5–14.5)
WBC: 11.3 10*3/uL — ABNORMAL HIGH (ref 3.6–11.0)

## 2015-08-24 LAB — TROPONIN I: Troponin I: <0.03 ng/mL (ref <0.031)

## 2015-08-24 MED ORDER — HYDROCODONE-ACETAMINOPHEN 5-325 MG PO TABS: 1.0000 | ORAL_TABLET | ORAL | Status: DC | PRN

## 2015-08-24 MED ORDER — SODIUM CHLORIDE 0.9 % IV SOLN: Freq: Once | INTRAVENOUS | Status: DC

## 2015-08-24 MED ORDER — SODIUM CHLORIDE 0.9 % IV SOLN
INTRAVENOUS | Status: DC
  Administered 2015-08-24 – 2015-08-27 (×4): via INTRAVENOUS

## 2015-08-24 MED ORDER — SODIUM CHLORIDE 0.9% FLUSH
3.0000 mL | Freq: Two times a day (BID) | INTRAVENOUS | Status: DC
  Administered 2015-08-25 – 2015-08-27 (×4): 3 mL via INTRAVENOUS

## 2015-08-24 MED ORDER — LEVOFLOXACIN 750 MG PO TABS: 750.0000 mg | ORAL_TABLET | Freq: Every day | ORAL | Status: DC

## 2015-08-24 MED ORDER — PANTOPRAZOLE SODIUM 40 MG PO TBEC
40.0000 mg | DELAYED_RELEASE_TABLET | Freq: Every day | ORAL | Status: DC
  Administered 2015-08-24 – 2015-08-27 (×4): 40 mg via ORAL
  Filled 2015-08-24 (×4): qty 1

## 2015-08-24 MED ORDER — OXYBUTYNIN CHLORIDE 5 MG PO TABS
5.0000 mg | ORAL_TABLET | Freq: Two times a day (BID) | ORAL | Status: DC
  Administered 2015-08-24 – 2015-08-27 (×5): 5 mg via ORAL
  Filled 2015-08-24 (×6): qty 1

## 2015-08-24 MED ORDER — SIMVASTATIN 20 MG PO TABS
20.0000 mg | ORAL_TABLET | Freq: Every day | ORAL | Status: DC
  Administered 2015-08-24 – 2015-08-27 (×4): 20 mg via ORAL
  Filled 2015-08-24 (×4): qty 1

## 2015-08-24 MED ORDER — ASPIRIN EC 325 MG PO TBEC
325.0000 mg | DELAYED_RELEASE_TABLET | Freq: Every day | ORAL | Status: DC
  Administered 2015-08-24 – 2015-08-27 (×4): 325 mg via ORAL
  Filled 2015-08-24 (×4): qty 1

## 2015-08-24 MED ORDER — LATANOPROST 0.005 % OP SOLN
1.0000 [drp] | Freq: Every day | OPHTHALMIC | Status: DC
  Administered 2015-08-24 – 2015-08-26 (×3): 1 [drp] via OPHTHALMIC
  Filled 2015-08-24: qty 2.5

## 2015-08-24 MED ORDER — ENOXAPARIN SODIUM 30 MG/0.3ML ~~LOC~~ SOLN
30.0000 mg | SUBCUTANEOUS | Status: DC
  Administered 2015-08-24 – 2015-08-26 (×3): 30 mg via SUBCUTANEOUS
  Filled 2015-08-24 (×3): qty 0.3

## 2015-08-24 MED ORDER — ONDANSETRON HCL 4 MG/2ML IJ SOLN: 4.0000 mg | Freq: Four times a day (QID) | INTRAMUSCULAR | Status: DC | PRN

## 2015-08-24 MED ORDER — TIMOLOL MALEATE 0.5 % OP SOLN
1.0000 [drp] | Freq: Every day | OPHTHALMIC | Status: DC
  Administered 2015-08-24 – 2015-08-27 (×4): 1 [drp] via OPHTHALMIC
  Filled 2015-08-24: qty 5

## 2015-08-24 MED ORDER — ACETAMINOPHEN 650 MG RE SUPP: 650.0000 mg | Freq: Four times a day (QID) | RECTAL | Status: DC | PRN

## 2015-08-24 MED ORDER — BISACODYL 5 MG PO TBEC
5.0000 mg | DELAYED_RELEASE_TABLET | Freq: Every day | ORAL | Status: DC | PRN
Start: 2015-08-24 — End: 2015-08-27
  Administered 2015-08-26: 10:00:00 5 mg via ORAL
  Filled 2015-08-24: qty 1

## 2015-08-24 MED ORDER — ALUM & MAG HYDROXIDE-SIMETH 200-200-20 MG/5ML PO SUSP: 30.0000 mL | Freq: Four times a day (QID) | ORAL | Status: DC | PRN

## 2015-08-24 MED ORDER — LEVOFLOXACIN 500 MG PO TABS
500.0000 mg | ORAL_TABLET | ORAL | Status: AC
  Administered 2015-08-25: 12:00:00 500 mg via ORAL
  Filled 2015-08-24: qty 1

## 2015-08-24 MED ORDER — BACLOFEN 10 MG PO TABS
10.0000 mg | ORAL_TABLET | Freq: Three times a day (TID) | ORAL | Status: DC
  Administered 2015-08-24 – 2015-08-27 (×7): 10 mg via ORAL
  Filled 2015-08-24 (×8): qty 1

## 2015-08-24 MED ORDER — SENNOSIDES-DOCUSATE SODIUM 8.6-50 MG PO TABS: 1.0000 | ORAL_TABLET | Freq: Every evening | ORAL | Status: DC | PRN

## 2015-08-24 MED ORDER — ONDANSETRON HCL 4 MG PO TABS: 4.0000 mg | ORAL_TABLET | Freq: Four times a day (QID) | ORAL | Status: DC | PRN

## 2015-08-24 MED ORDER — HYDROCOD POLST-CPM POLST ER 10-8 MG/5ML PO SUER
5.0000 mL | Freq: Two times a day (BID) | ORAL | Status: DC
  Administered 2015-08-24 – 2015-08-27 (×5): 5 mL via ORAL
  Filled 2015-08-24 (×6): qty 5

## 2015-08-24 MED ORDER — ACETAMINOPHEN 325 MG PO TABS
650.0000 mg | ORAL_TABLET | Freq: Four times a day (QID) | ORAL | Status: DC | PRN
  Administered 2015-08-26: 22:00:00 650 mg via ORAL
  Filled 2015-08-24: qty 2

## 2015-08-24 NOTE — ED Notes (Signed)
Brought in by family from [facility], reports slurred speech and generalized weakness and confusion x 3 days.

## 2015-08-24 NOTE — ED Provider Notes (Signed)
[facility] Emergency Department Provider Note  ____________________________________________    I have reviewed the triage vital signs and the nursing notes.   HISTORY  Chief Complaint Aphasia    HPI Janet Pope is a 82 y.o. female who presents with complaints of generalized weakness and confusion and a period of slurred speech. Family reports that she was unintelligible earlier today and this made him concerned she was having a stroke. She has had mild upper respiratory symptoms which have mostly resolved. She denies dysuria. No fevers today. She has not had an extremity weakness. She reports she feels well     Past Medical History  Diagnosis Date  . HLD (hyperlipidemia)   . GERD (gastroesophageal reflux disease)   . Kidney stone   . H/O lumpectomy   . Obstructive sleep apnea   . Gout   . Diverticulosis   . Nephrolithiasis     history of right lithotripsy  . History of valvular heart disease   . HTN (hypertension)   . Breast cancer ([place])   . CLL (chronic lymphocytic leukemia) ([place])   . Stomach cancer ([place])   . Gout   . CHF (congestive heart failure) ([place])   . Arthritis     Patient Active Problem List   Diagnosis Date Noted  . Gout   . Mitral stenosis 10/03/2012  . Palpitations 10/03/2012  . Dyspnea 08/02/2012  . HTN (hypertension)     Past Surgical History  Procedure Laterality Date  . Total abdominal hysterectomy w/ bilateral salpingoophorectomy    . Breast lumpectomy    . Partial hip arthroplasty      right  . Cataract extraction, bilateral    . Lithotripsy      right  . Portacath placement    . Abdominal hysterectomy      Current Outpatient Rx  Name  Route  Sig  Dispense  Refill  . amLODipine (NORVASC) 2.5 MG tablet   Oral   Take 2.5 mg by mouth daily.         . baclofen (LIORESAL) 10 MG tablet   Oral   Take 1 tablet (10 mg total) by mouth 3 (three) times daily.   30 tablet   0   . Bisacodyl (DULCOLAX PO)    Oral   Take by mouth as needed.         . carvedilol (COREG) 25 MG tablet   Oral   Take 25 mg by mouth 2 (two) times daily with a meal.          . chlorpheniramine-HYDROcodone (TUSSIONEX PENNKINETIC ER) 10-8 MG/5ML SUER   Oral   Take 5 mLs by mouth 2 (two) times daily.   140 mL   0   . furosemide (LASIX) 20 MG tablet   Oral   Take 20 mg by mouth as needed.         Marland Kitchen HYDROcodone-acetaminophen (NORCO) 5-325 MG tablet   Oral   Take 1-2 tablets by mouth every 4 (four) hours as needed for moderate pain.   15 tablet   0   . ibuprofen (ADVIL,MOTRIN) 600 MG tablet   Oral   Take 1 tablet (600 mg total) by mouth every 6 (six) hours as needed.   30 tablet   0   . Ipratropium-Albuterol (COMBIVENT IN)   Inhalation   Inhale into the lungs as needed.         . latanoprost (XALATAN) 0.005 % ophthalmic solution      1  drop at bedtime.         Marland Kitchen levofloxacin (LEVAQUIN) 750 MG tablet   Oral   Take 1 tablet (750 mg total) by mouth daily.   5 tablet   0   . omeprazole (PRILOSEC) 20 MG capsule   Oral   Take 20 mg by mouth daily.         Marland Kitchen oxybutynin (DITROPAN) 5 MG tablet   Oral   Take 5 mg by mouth 2 (two) times daily.         . simvastatin (ZOCOR) 20 MG tablet   Oral   Take 20 mg by mouth daily.         . timolol (BETIMOL) 0.5 % ophthalmic solution   Both Eyes   Place 1 drop into both eyes as needed.           Allergies Ace inhibitors; Aspirin; Eggs or egg-derived products; and Milk-related compounds  Family History  Problem Relation Age of Onset  . Hypertension Sister     Social History Social History  Substance Use Topics  . Smoking status: Never Smoker   . Smokeless tobacco: None  . Alcohol Use: No    Review of Systems  Constitutional: Negative for fever. Eyes: Negative for redness ENT: Negative for sore throat Cardiovascular: Negative for chest pain Respiratory: Negative forCough Gastrointestinal: Negative for abdominal  pain Genitourinary: Negative for dysuria. For frequency Musculoskeletal: Negative for back pain. Skin: Negative for rash. Neurological: Negative for focal weakness, positive for difficult speaking Psychiatric: no anxiety    ____________________________________________   PHYSICAL EXAM:  VITAL SIGNS: ED Triage Vitals  Enc Vitals Group     BP 08/24/15 1309 96/55 mmHg     Pulse Rate 08/24/15 1309 86     Resp 08/24/15 1309 14     Temp --      Temp src --      SpO2 08/24/15 1309 100 %     Weight 08/24/15 1309 160 lb (72.576 kg)     Height 08/24/15 1309 5\' 3"  (1.6 m)     Head Cir --      Peak Flow --      Pain Score 08/24/15 1252 0     Pain Loc --      Pain Edu? --      Excl. in [place]? --     Constitutional: Alert and oriented. Well appearing and in no distress.  Eyes: Conjunctivae are normal. No erythema or injection ENT   Head: Normocephalic and atraumatic.   Mouth/Throat: Mucous membranes are moist. Cardiovascular: Normal rate, regular rhythm. Normal and symmetric distal pulses are present in the upper extremities.   Respiratory: Normal respiratory effort without tachypnea nor retractions. Breath sounds are clear and equal bilaterally.  Gastrointestinal: Soft and non-tender in all quadrants. No distention. There is no CVA tenderness. Genitourinary: deferred Musculoskeletal: Nontender with normal range of motion in all extremities. No lower extremity tenderness nor edema. Neurologic:  Normal speech and language. No gross focal neurologic deficits are appreciated. Skin:  Skin is warm, dry and intact. No rash noted. Psychiatric: Mood and affect are normal. Patient exhibits appropriate insight and judgment.  ____________________________________________    LABS (pertinent positives/negatives)  Labs Reviewed  CBC - Abnormal; Notable for the following:    WBC 11.3 (*)    RBC 3.03 (*)    Hemoglobin 7.2 (*)    HCT 22.8 (*)    MCV 75.1 (*)    MCH 23.9 (*)    MCHC  31.8  (*)    RDW 18.3 (*)    All other components within normal limits  COMPREHENSIVE METABOLIC PANEL - Abnormal; Notable for the following:    Potassium 3.4 (*)    Glucose, Bld 114 (*)    BUN 35 (*)    Creatinine, Ser 2.04 (*)    Albumin 2.3 (*)    AST 10 (*)    ALT 11 (*)    GFR calc non Af Amer 21 (*)    GFR calc Af Amer 24 (*)    All other components within normal limits  TROPONIN I  URINALYSIS COMPLETEWITH MICROSCOPIC ([facility] ONLY)    ____________________________________________   EKG  ED ECG REPORT I, Lavonia Drafts, the attending physician, personally viewed and interpreted this ECG.  Date: 08/24/2015 EKG Time: 1:19 PM Rate: 86 Rhythm: normal sinus rhythm QRS Axis: normal Intervals: normal ST/T Wave abnormalities: normal Conduction Disturbances: none Narrative Interpretation: Nonspecific  ____________________________________________    RADIOLOGY  CT head unremarkable  ____________________________________________   PROCEDURES  Procedure(s) performed: none  Critical Care performed:none  ____________________________________________   INITIAL IMPRESSION / ASSESSMENT AND PLAN / ED COURSE  Pertinent labs & imaging results that were available during my care of the patient were reviewed by me and considered in my medical decision making (see chart for details).  Patient with concerning presentation for TIA, less likely UTI versus infection. She also has elevation in creatinine consistent with acute renal failure. I will admit the patient to the hospitalist for further evaluation  ____________________________________________   FINAL CLINICAL IMPRESSION(S) / ED DIAGNOSES  Final diagnoses:  Transient cerebral ischemia, unspecified transient cerebral ischemia type  Acute renal failure        Lavonia Drafts, MD 08/24/15 1555

## 2015-08-24 NOTE — Progress Notes (Signed)
Anticoagulation monitoring(Lovenox):  82 yo  ordered Lovenox 40 mg Q24h  Filed Weights   08/24/15 1309  Weight: 160 lb (72.576 kg)   BMI  Lab Results  Component Value Date   CREATININE 2.04* 08/24/2015   CREATININE 0.83 08/21/2015   CREATININE 1.36* 05/19/2014   Estimated Creatinine Clearance: 18.9 mL/min (by C-G formula based on Cr of 2.04). Hemoglobin & Hematocrit     Component Value Date/Time   HGB 7.2* 08/24/2015 1445   HGB 10.8* 05/19/2014 0900   HCT 22.8* 08/24/2015 1445   HCT 33.5* 05/19/2014 0900     Per Protocol for Patient with estCrcl< 30 ml/min and BMI < 40, will transition to Lovenox 30 mg Q24h.

## 2015-08-24 NOTE — Progress Notes (Addendum)
ANTIBIOTIC CONSULT NOTE - INITIAL  Pharmacy Consult for Levaquin  Indication: recent cough and fever   Allergies  Allergen Reactions  . Aspirin Shortness Of Breath  . Ace Inhibitors Other (See Comments)    Reaction:  Unknown   . Eggs Or Egg-Derived Products Other (See Comments)    Reaction:  Unknown   . Milk-Related Compounds Other (See Comments)    Reaction:  Unknown   . Whey Other (See Comments)    Reaction:  Unknown     Patient Measurements: Height: 5\' 3"  (160 cm) Weight: 160 lb (72.576 kg) IBW/kg (Calculated) : 52.4 Adjusted Body Weight:   Vital Signs: Temp: 98.1 F (36.7 C) (03/14 2025) Temp Source: Oral (03/14 2025) BP: 97/78 mmHg (03/14 2025) Pulse Rate: 99 (03/14 2025) Intake/Output from previous day:   Intake/Output from this shift:    Labs:  Recent Labs  08/24/15 1445  WBC 11.3*  HGB 7.2*  PLT 309  CREATININE 2.04*   Estimated Creatinine Clearance: 18.9 mL/min (by C-G formula based on Cr of 2.04). No results for input(s): VANCOTROUGH, VANCOPEAK, VANCORANDOM, GENTTROUGH, GENTPEAK, GENTRANDOM, TOBRATROUGH, TOBRAPEAK, TOBRARND, AMIKACINPEAK, AMIKACINTROU, AMIKACIN in the last 72 hours.   Microbiology: Recent Results (from the past 720 hour(s))  Blood culture (routine x 2)     Status: None (Preliminary result)   Collection Time: 08/21/15  7:23 PM  Result Value Ref Range Status   Specimen Description BLOOD LEFT HAND  Final   Special Requests BOTTLES DRAWN AEROBIC AND ANAEROBIC  0.5CC  Final   Culture NO GROWTH 3 DAYS  Final   Report Status PENDING  Incomplete  Blood culture (routine x 2)     Status: None (Preliminary result)   Collection Time: 08/21/15  7:23 PM  Result Value Ref Range Status   Specimen Description BLOOD RIGHT HAND  Final   Special Requests BOTTLES DRAWN AEROBIC AND ANAEROBIC  0.5CC  Final   Culture NO GROWTH 3 DAYS  Final   Report Status PENDING  Incomplete  Rapid Influenza A&B Antigens ([place] only)     Status: None   Collection  Time: 08/21/15  7:23 PM  Result Value Ref Range Status   Influenza A ([facility]) NEGATIVE NEGATIVE Final   Influenza B ([facility]) NEGATIVE NEGATIVE Final    Medical History: Past Medical History  Diagnosis Date  . HLD (hyperlipidemia)   . GERD (gastroesophageal reflux disease)   . Kidney stone   . H/O lumpectomy   . Obstructive sleep apnea   . Gout   . Diverticulosis   . Nephrolithiasis     history of right lithotripsy  . History of valvular heart disease   . HTN (hypertension)   . Breast cancer ([place])   . CLL (chronic lymphocytic leukemia) ([place])   . Stomach cancer ([place])   . Gout   . CHF (congestive heart failure) ([place])   . Arthritis     Medications:  Prescriptions prior to admission  Medication Sig Dispense Refill Last Dose  . amLODipine (NORVASC) 2.5 MG tablet Take 2.5 mg by mouth daily.   Taking  . baclofen (LIORESAL) 10 MG tablet Take 1 tablet (10 mg total) by mouth 3 (three) times daily. 30 tablet 0   . Bisacodyl (DULCOLAX PO) Take by mouth as needed.   Taking  . carvedilol (COREG) 25 MG tablet Take 25 mg by mouth 2 (two) times daily with a meal.    Taking  . chlorpheniramine-HYDROcodone (TUSSIONEX PENNKINETIC ER) 10-8 MG/5ML SUER Take 5 mLs by mouth 2 (  two) times daily. 140 mL 0   . furosemide (LASIX) 20 MG tablet Take 20 mg by mouth as needed.   Taking  . HYDROcodone-acetaminophen (NORCO) 5-325 MG tablet Take 1-2 tablets by mouth every 4 (four) hours as needed for moderate pain. 15 tablet 0   . ibuprofen (ADVIL,MOTRIN) 600 MG tablet Take 1 tablet (600 mg total) by mouth every 6 (six) hours as needed. 30 tablet 0   . Ipratropium-Albuterol (COMBIVENT IN) Inhale into the lungs as needed.   Taking  . latanoprost (XALATAN) 0.005 % ophthalmic solution Place 1 drop into both eyes at bedtime.      Marland Kitchen levofloxacin (LEVAQUIN) 750 MG tablet Take 1 tablet (750 mg total) by mouth daily. 5 tablet 0   . omeprazole (PRILOSEC) 20 MG capsule Take 20 mg by mouth daily.   Taking  . oxybutynin  (DITROPAN) 5 MG tablet Take 5 mg by mouth 2 (two) times daily.   Taking  . simvastatin (ZOCOR) 20 MG tablet Take 20 mg by mouth at bedtime.    Taking  . timolol (BETIMOL) 0.5 % ophthalmic solution Place 1 drop into both eyes 2 (two) times daily.       Assessment: CrCl = 18.9 ml/min  Pt was on levaquin 750 mg PO daily at home , pt took last dose on 3/13.   Goal of Therapy:  resolution of infection  Plan:  Expected duration 7 days with resolution of temperature and/or normalization of WBC   Levaquin 750 mg PO daily originally ordered.  Will adjust dose to  Levaquin 500 mg PO Q48H to start 3/15 @ 10:00.   Janet Pope D 08/24/2015,8:34 PM

## 2015-08-24 NOTE — H&P (Signed)
[facility] NAME: Janet Pope    MR#:  [id number]  DATE OF BIRTH:  March 07, 1927  DATE OF ADMISSION:  08/24/2015  PRIMARY CARE PHYSICIAN:  Dr Ladoris Gene  REQUESTING/REFERRING PHYSICIAN: Dr Corky Downs  CHIEF COMPLAINT:   Slurred speech HISTORY OF PRESENT ILLNESS:  Janet Pope  is a 82 y.o. female with a known history of hyperlipidemia and CLL who presents from her primary care physician office for following issue. Since yesterday patient has had slurred speech and not acting right associated with some memory loss. Family was concerned so patient was seen by her primary care physician. He sent her to the hospital due to anemia and slurred speech. She was seen in the emergency room on March 11 and diagnosed with cough and fever sent home on Tussionex and Levaquin. Lab work today reveals acute renal failure and hemoglobin is 7.2. Her blood pressure was also low at her primary care physician office. It was 92/59. In the emergency room her blood pressure has been low normal. Her slurred speech has resolved she has no neurological deficits at this time. NIH score of 1. No TPA given due to the fact the patient's symptoms started yesterday and she presents today  PAST MEDICAL HISTORY:   Past Medical History  Diagnosis Date  . HLD (hyperlipidemia)   . GERD (gastroesophageal reflux disease)   . Kidney stone   . H/O lumpectomy   . Obstructive sleep apnea   . Gout   . Diverticulosis   . Nephrolithiasis     history of right lithotripsy  . History of valvular heart disease   . HTN (hypertension)   . Breast cancer ([place])   . CLL (chronic lymphocytic leukemia) ([place])   . Stomach cancer ([place])   . Gout   . CHF (congestive heart failure) ([place])   . Arthritis     PAST SURGICAL HISTORY:   Past Surgical History  Procedure Laterality Date  . Total abdominal hysterectomy w/ bilateral salpingoophorectomy    . Breast lumpectomy    .  Partial hip arthroplasty      right  . Cataract extraction, bilateral    . Lithotripsy      right  . Portacath placement    . Abdominal hysterectomy      SOCIAL HISTORY:   Social History  Substance Use Topics  . Smoking status: Never Smoker   . Smokeless tobacco: Not on file  . Alcohol Use: No    FAMILY HISTORY:   Family History  Problem Relation Age of Onset  . Hypertension Sister     DRUG ALLERGIES:   Allergies  Allergen Reactions  . Aspirin Shortness Of Breath  . Ace Inhibitors   . Eggs Or Egg-Derived Products   . Milk-Related Compounds      REVIEW OF SYSTEMS:  CONSTITUTIONAL: No fever, fatigue or weakness.  EYES: No blurred or double vision.  EARS, NOSE, AND THROAT: No tinnitus or ear pain.  RESPIRATORY: No cough, shortness of breath, wheezing or hemoptysis.  CARDIOVASCULAR: No chest pain, orthopnea, edema.  GASTROINTESTINAL: No nausea, vomiting, diarrhea or abdominal pain.  GENITOURINARY: No dysuria, hematuria.  ENDOCRINE: No polyuria, nocturia,  HEMATOLOGY: Positive history of CLL with anemia anemia, no easy bruising or bleeding SKIN: No rash or lesion. MUSCULOSKELETAL: No joint pain or arthritis.   NEUROLOGIC: Slurred speech and generalized weakness no focal deficit.  PSYCHIATRY: No anxiety or depression.   MEDICATIONS AT HOME:   Prior to Admission  medications   Medication Sig Start Date End Date Taking? Authorizing Provider  amLODipine (NORVASC) 2.5 MG tablet Take 2.5 mg by mouth daily.    Historical Provider, MD  baclofen (LIORESAL) 10 MG tablet Take 1 tablet (10 mg total) by mouth 3 (three) times daily. 08/08/15   Pierce Crane Beers, PA-C  Bisacodyl (DULCOLAX PO) Take by mouth as needed.    Historical Provider, MD  carvedilol (COREG) 25 MG tablet Take 25 mg by mouth 2 (two) times daily with a meal.  09/24/12   Historical Provider, MD  chlorpheniramine-HYDROcodone (TUSSIONEX PENNKINETIC ER) 10-8 MG/5ML SUER Take 5 mLs by mouth 2 (two) times daily.  08/21/15   Earleen Newport, MD  furosemide (LASIX) 20 MG tablet Take 20 mg by mouth as needed.    Historical Provider, MD  HYDROcodone-acetaminophen (NORCO) 5-325 MG tablet Take 1-2 tablets by mouth every 4 (four) hours as needed for moderate pain. 08/08/15   Pierce Crane Beers, PA-C  ibuprofen (ADVIL,MOTRIN) 600 MG tablet Take 1 tablet (600 mg total) by mouth every 6 (six) hours as needed. 08/08/15   Pierce Crane Beers, PA-C  Ipratropium-Albuterol (COMBIVENT IN) Inhale into the lungs as needed.    Historical Provider, MD  latanoprost (XALATAN) 0.005 % ophthalmic solution 1 drop at bedtime.    Historical Provider, MD  levofloxacin (LEVAQUIN) 750 MG tablet Take 1 tablet (750 mg total) by mouth daily. 08/21/15   Earleen Newport, MD  omeprazole (PRILOSEC) 20 MG capsule Take 20 mg by mouth daily.    Historical Provider, MD  oxybutynin (DITROPAN) 5 MG tablet Take 5 mg by mouth 2 (two) times daily.    Historical Provider, MD  simvastatin (ZOCOR) 20 MG tablet Take 20 mg by mouth daily.    Historical Provider, MD  timolol (BETIMOL) 0.5 % ophthalmic solution Place 1 drop into both eyes as needed.    Historical Provider, MD      VITAL SIGNS:  Blood pressure 111/51, pulse 86, resp. rate 16, height 5\' 3"  (1.6 m), weight 72.576 kg (160 lb), SpO2 100 %.  PHYSICAL EXAMINATION:  GENERAL:  82 y.o.-year-old patient lying in the bed with no acute distress.  EYES: Pupils equal, round, reactive to light and accommodation. No scleral icterus. Extraocular muscles intact.  HEENT: Head atraumatic, normocephalic. Oropharynx and nasopharynx clear.  NECK:  Supple, no jugular venous distention. No thyroid enlargement, no tenderness.  LUNGS: Normal breath sounds bilaterally, no wheezing, rales,rhonchi or crepitation. No use of accessory muscles of respiration.  CARDIOVASCULAR: S1, S2 normal. No murmurs, rubs, or gallops.  ABDOMEN: Soft, nontender, nondistended. Bowel sounds present. No organomegaly or mass.  EXTREMITIES:  No pedal edema, cyanosis, or clubbing.  NEUROLOGIC: Cranial nerves II through XII are grossly intact. No focal deficits. PSYCHIATRIC: The patient is alert and oriented x 3.  SKIN: No obvious rash, lesion, or ulcer.   LABORATORY PANEL:   CBC  Recent Labs Lab 08/24/15 1445  WBC 11.3*  HGB 7.2*  HCT 22.8*  PLT 309   ------------------------------------------------------------------------------------------------------------------  Chemistries   Recent Labs Lab 08/24/15 1445  NA 135  K 3.4*  CL 102  CO2 25  GLUCOSE 114*  BUN 35*  CREATININE 2.04*  CALCIUM 10.1  AST 10*  ALT 11*  ALKPHOS 94  BILITOT 0.9   ------------------------------------------------------------------------------------------------------------------  Cardiac Enzymes  Recent Labs Lab 08/24/15 1445  TROPONINI <0.03   ------------------------------------------------------------------------------------------------------------------  RADIOLOGY:  Ct Head Wo Contrast  08/24/2015  CLINICAL DATA:  Slurred speech.  Weakness and confusion for  3 days. EXAM: CT HEAD WITHOUT CONTRAST TECHNIQUE: Contiguous axial images were obtained from the base of the skull through the vertex without intravenous contrast. COMPARISON:  09/27/2011 PET FINDINGS: Sinuses/Soft tissues: No significant soft tissue swelling. Clear paranasal sinuses and mastoid air cells. Hyperostosis frontalis interna. Intracranial: Mild to moderate low density in the periventricular white matter likely related to small vessel disease. Cerebral and cerebellar volume loss is than normal limits for age. No mass lesion, hemorrhage, hydrocephalus, acute infarct, intra-axial, or extra-axial fluid collection. IMPRESSION: 1.  No acute intracranial abnormality. 2.  Cerebral atrophy and small vessel ischemic change. Electronically Signed   By: Abigail Miyamoto M.D.   On: 08/24/2015 14:52    EKG:  Normal sinus rhythm with LVH no ST elevation or  depression  IMPRESSION AND PLAN:   82 year old female with a history of CLL and hyperlipidemia who presents with generalized weakness and slurred speech since yesterday.  1. Acute stroke: Patient symptoms are concerning for CVA. MRI, echo and carotid Doppler will be ordered. Continue neuro checks. Neurology consultation has been placed and discussed with neurologist. Start aspirin and statin. Check lipid panel PT consult and case management consult.   2. Acute on chronic anemia: Order anemia panel. Transfuse 1 unit PRBCs Hemoglobin for a.m.  3. Acute renal failure: This is likely due to poor by mouth intake. Start IV fluids and recheck BMP in a.m. If creatinine still elevated then I would consider ordering a renal ultrasound for further evaluation. Monitor input and output. Hold nephrotoxic agents including ibuprofen.  4. History of hypertension with hypotension: Discontinue Norvasc and Coreg. Transfuse 1 unit of PRBC and provide IV fluid. In 2 to monitor blood pressure.  5. Recent cough and fever: Continue Levaquin. Can discontinue on March 16.  6. Hyperlipidemia: Check lipid panel and continue simvastatin.    All the records are reviewed and case discussed with ED provider. Management plans discussed with the patient and she is in agreement.  CODE STATUS: LIMITED NO MV  TOTAL TIME TAKING CARE OF THIS PATIENT: 50 minutes.    Oryon Gary M.D on 08/24/2015 at 4:10 PM  Between 7am to 6pm - Pager - [phone] After 6pm go to [email or web address] - password [email or web address] [facility]  Office  [phone]  CC: Primary care physician; Pcp Not In System

## 2015-08-25 ENCOUNTER — Inpatient Hospital Stay: Payer: PPO

## 2015-08-25 DIAGNOSIS — R4781 Slurred speech: Secondary | ICD-10-CM

## 2015-08-25 LAB — HEMOGLOBIN AND HEMATOCRIT, BLOOD
HEMATOCRIT: 24.7 % — AB (ref 35.0–47.0)
Hemoglobin: 8.2 g/dL — ABNORMAL LOW (ref 12.0–16.0)

## 2015-08-25 LAB — IRON AND TIBC
IRON: 18 ug/dL — AB (ref 28–170)
TIBC: 95 ug/dL — ABNORMAL LOW (ref 250–450)

## 2015-08-25 LAB — ECHOCARDIOGRAM COMPLETE
Height: 63 in
WEIGHTICAEL: 2560 [oz_av]

## 2015-08-25 LAB — CREATININE, SERUM
CREATININE: 1.89 mg/dL — AB (ref 0.44–1.00)
GFR, EST AFRICAN AMERICAN: 27 mL/min — AB (ref >60)
GFR, EST NON AFRICAN AMERICAN: 23 mL/min — AB (ref >60)

## 2015-08-25 LAB — LIPID PANEL
Cholesterol: 83 mg/dL (ref 0–200)
HDL: 10 mg/dL — ABNORMAL LOW (ref >40)
LDL CALC: 54 mg/dL (ref 0–99)
TRIGLYCERIDES: 97 mg/dL (ref <150)
Total CHOL/HDL Ratio: 8.3 RATIO
VLDL: 19 mg/dL (ref 0–40)

## 2015-08-25 LAB — ABO/RH: ABO/RH(D): B POS

## 2015-08-25 LAB — TSH: TSH: 1.439 u[IU]/mL (ref 0.350–4.500)

## 2015-08-25 LAB — HEMOGLOBIN: Hemoglobin: 6.4 g/dL — ABNORMAL LOW (ref 12.0–16.0)

## 2015-08-25 LAB — PREPARE RBC (CROSSMATCH)

## 2015-08-25 LAB — FOLATE: FOLATE: 5.2 ng/mL — AB (ref >5.9)

## 2015-08-25 LAB — FERRITIN: FERRITIN: 1032 ng/mL — AB (ref 11–307)

## 2015-08-25 LAB — HEMOGLOBIN A1C: Hgb A1c MFr Bld: 5.1 % (ref 4.0–6.0)

## 2015-08-25 MED ORDER — DEXTROSE 5 % IV SOLN
1.0000 g | INTRAVENOUS | Status: DC
  Administered 2015-08-26: 1 g via INTRAVENOUS
  Filled 2015-08-25 (×2): qty 10

## 2015-08-25 NOTE — Consult Note (Signed)
Reason for Consult:Slurred speech Referring Physician: Gouru  CC: Slurred speech  HPI: Janet Pope is an 82 y.o. female who was admitted after being seen by her PCP for slurred speech and not acting right.  On presentation the report was that her slurred speech had been present since 3/13 but on speaking with her sister today she reports that she noted on the phone last week as well.  Patient was seen in the ED on 3/11 with cough and fever.  Was given Tussionex and Levaquin.  Per report of family has not been doing well at home since that time and has not been eating or drinking well.  Family reports that speech is at baseline at this time.   At baseline patient is independent and driving.    Past Medical History  Diagnosis Date  . HLD (hyperlipidemia)   . GERD (gastroesophageal reflux disease)   . Kidney stone   . H/O lumpectomy   . Obstructive sleep apnea   . Gout   . Diverticulosis   . Nephrolithiasis     history of right lithotripsy  . History of valvular heart disease   . HTN (hypertension)   . Breast cancer ([place])   . CLL (chronic lymphocytic leukemia) ([place])   . Stomach cancer ([place])   . Gout   . CHF (congestive heart failure) ([place])   . Arthritis     Past Surgical History  Procedure Laterality Date  . Total abdominal hysterectomy w/ bilateral salpingoophorectomy    . Breast lumpectomy    . Partial hip arthroplasty      right  . Cataract extraction, bilateral    . Lithotripsy      right  . Portacath placement    . Abdominal hysterectomy      Family History  Problem Relation Age of Onset  . Hypertension Sister     Social History:  reports that she has never smoked. She does not have any smokeless tobacco history on file. She reports that she does not drink alcohol or use illicit drugs.  Allergies  Allergen Reactions  . Aspirin Shortness Of Breath  . Ace Inhibitors Other (See Comments)    Reaction:  Unknown   . Eggs Or Egg-Derived Products Other (See  Comments)    Reaction:  Unknown   . Milk-Related Compounds Other (See Comments)    Reaction:  Unknown   . Whey Other (See Comments)    Reaction:  Unknown     Medications:  I have reviewed the patient's current medications. Prior to Admission:  Prescriptions prior to admission  Medication Sig Dispense Refill Last Dose  . amLODipine (NORVASC) 2.5 MG tablet Take 2.5 mg by mouth daily.   Taking  . baclofen (LIORESAL) 10 MG tablet Take 1 tablet (10 mg total) by mouth 3 (three) times daily. 30 tablet 0   . Bisacodyl (DULCOLAX PO) Take by mouth as needed.   Taking  . carvedilol (COREG) 25 MG tablet Take 25 mg by mouth 2 (two) times daily with a meal.    Taking  . chlorpheniramine-HYDROcodone (TUSSIONEX PENNKINETIC ER) 10-8 MG/5ML SUER Take 5 mLs by mouth 2 (two) times daily. 140 mL 0   . furosemide (LASIX) 20 MG tablet Take 20 mg by mouth as needed.   Taking  . HYDROcodone-acetaminophen (NORCO) 5-325 MG tablet Take 1-2 tablets by mouth every 4 (four) hours as needed for moderate pain. 15 tablet 0   . ibuprofen (ADVIL,MOTRIN) 600 MG tablet Take 1 tablet (600 mg  total) by mouth every 6 (six) hours as needed. 30 tablet 0   . Ipratropium-Albuterol (COMBIVENT IN) Inhale into the lungs as needed.   Taking  . latanoprost (XALATAN) 0.005 % ophthalmic solution Place 1 drop into both eyes at bedtime.      Marland Kitchen levofloxacin (LEVAQUIN) 750 MG tablet Take 1 tablet (750 mg total) by mouth daily. 5 tablet 0   . omeprazole (PRILOSEC) 20 MG capsule Take 20 mg by mouth daily.   Taking  . oxybutynin (DITROPAN) 5 MG tablet Take 5 mg by mouth 2 (two) times daily.   Taking  . polyethylene glycol powder (GLYCOLAX/MIRALAX) powder Take 17 g by mouth daily as needed.     . simvastatin (ZOCOR) 20 MG tablet Take 20 mg by mouth at bedtime.    Taking  . timolol (BETIMOL) 0.5 % ophthalmic solution Place 1 drop into both eyes 2 (two) times daily.      . VENTOLIN HFA 108 (90 Base) MCG/ACT inhaler Inhale 1-2 puffs into the lungs  as needed.      Scheduled: . sodium chloride   Intravenous Once  . sodium chloride   Intravenous Once  . aspirin EC  325 mg Oral Daily  . baclofen  10 mg Oral TID  . chlorpheniramine-HYDROcodone  5 mL Oral BID  . enoxaparin (LOVENOX) injection  30 mg Subcutaneous Q24H  . latanoprost  1 drop Both Eyes QHS  . levofloxacin  500 mg Oral Q48H  . oxybutynin  5 mg Oral BID  . pantoprazole  40 mg Oral Daily  . simvastatin  20 mg Oral Daily  . sodium chloride flush  3 mL Intravenous Q12H  . timolol  1 drop Both Eyes Daily    ROS: History obtained from the patient  General ROS: negative for - chills, fatigue, fever, night sweats, weight gain or weight loss Psychological ROS: negative for - behavioral disorder, hallucinations, memory difficulties, mood swings or suicidal ideation Ophthalmic ROS: decreased vision left eye ENT ROS: negative for - epistaxis, nasal discharge, oral lesions, sore throat, tinnitus or vertigo Allergy and Immunology ROS: negative for - hives or itchy/watery eyes Hematological and Lymphatic ROS: negative for - bleeding problems, bruising or swollen lymph nodes Endocrine ROS: negative for - galactorrhea, hair pattern changes, polydipsia/polyuria or temperature intolerance Respiratory ROS: negative for - cough, hemoptysis, shortness of breath or wheezing Cardiovascular ROS: negative for - chest pain, dyspnea on exertion, edema or irregular heartbeat Gastrointestinal ROS: negative for - abdominal pain, diarrhea, hematemesis, nausea/vomiting or stool incontinence Genito-Urinary ROS: negative for - dysuria, hematuria, incontinence or urinary frequency/urgency Musculoskeletal ROS: negative for - joint swelling or muscular weakness Neurological ROS: as noted in HPI Dermatological ROS: negative for rash and skin lesion changes  Physical Examination: Blood pressure 137/55, pulse 64, temperature 98.4 F (36.9 C), temperature source Oral, resp. rate 21, height 5\' 3"  (1.6 m),  weight 70.353 kg (155 lb 1.6 oz), SpO2 100 %.  HEENT-  Normocephalic, no lesions, without obvious abnormality.  Normal external eye and conjunctiva.  Normal TM's bilaterally.  Normal auditory canals and external ears. Normal external nose, mucus membranes and septum.  Normal pharynx. Cardiovascular- S1, S2 normal, pulses palpable throughout   Lungs- chest clear, no wheezing, rales, normal symmetric air entry Abdomen- soft, non-tender; bowel sounds normal; no masses,  no organomegaly Extremities- no edema Lymph-no adenopathy palpable Musculoskeletal-no joint tenderness, deformity or swelling Skin-warm and dry, no hyperpigmentation, vitiligo, or suspicious lesions  Neurological Examination Mental Status: Lethargic but easily alerted.  Speech fluent without evidence of aphasia.  Able to follow 3 step commands without difficulty. Cranial Nerves: II: Discs flat bilaterally; Decreased vision left eye, pupils irregular and unreactive  III,IV, VI: ptosis not present, extra-ocular motions intact bilaterally V,VII: smile symmetric, facial light touch sensation normal bilaterally VIII: hearing normal bilaterally IX,X: gag reflex present XI: bilateral shoulder shrug XII: midline tongue extension Motor: Right : Upper extremity   5/5    Left:     Upper extremity   5/5  Lower extremity   5/5     Lower extremity   5/5 Tone and bulk:normal tone throughout; no atrophy noted Sensory: Pinprick and light touch intact throughout, bilaterally Deep Tendon Reflexes: 2+in the upper extremities, 1+ at the knees and absent at the ankles Plantars: Right: downgoing   Left: downgoing Cerebellar: Normal finger-to-nose and normal heel-to-shin testing bilaterally Gait: not tested due to safety concerns  Laboratory Studies:   Basic Metabolic Panel:  Recent Labs Lab 08/21/15 1923 08/24/15 1445 08/24/15 2345  NA 135 135  --   K 3.9 3.4*  --   CL 107 102  --   CO2 21* 25  --   GLUCOSE 93 114*  --   BUN 10  35*  --   CREATININE 0.83 2.04* 1.89*  CALCIUM 9.2 10.1  --     Liver Function Tests:  Recent Labs Lab 08/21/15 1923 08/24/15 1445  AST 21 10*  ALT 17 11*  ALKPHOS 100 94  BILITOT 1.3* 0.9  PROT 7.1 6.8  ALBUMIN 2.7* 2.3*   No results for input(s): LIPASE, AMYLASE in the last 168 hours. No results for input(s): AMMONIA in the last 168 hours.  CBC:  Recent Labs Lab 08/21/15 2021 08/24/15 1445 08/24/15 2345 08/25/15 0057 08/25/15 0529  WBC 10.6 11.3* 11.0  --   --   NEUTROABS 8.8*  --   --   --   --   HGB 7.9* 7.2* 3.9* 6.4* 8.2*  HCT 24.0* 22.8* 12.5*  --  24.7*  MCV 76.4* 75.1* 75.9*  --   --   PLT 256 309 354  --   --     Cardiac Enzymes:  Recent Labs Lab 08/24/15 1445  TROPONINI <0.03    BNP: Invalid input(s): POCBNP  CBG: No results for input(s): GLUCAP in the last 168 hours.  Microbiology: Results for orders placed or performed during the hospital encounter of 08/21/15  Blood culture (routine x 2)     Status: None (Preliminary result)   Collection Time: 08/21/15  7:23 PM  Result Value Ref Range Status   Specimen Description BLOOD LEFT HAND  Final   Special Requests BOTTLES DRAWN AEROBIC AND ANAEROBIC  0.5CC  Final   Culture NO GROWTH 4 DAYS  Final   Report Status PENDING  Incomplete  Blood culture (routine x 2)     Status: None (Preliminary result)   Collection Time: 08/21/15  7:23 PM  Result Value Ref Range Status   Specimen Description BLOOD RIGHT HAND  Final   Special Requests BOTTLES DRAWN AEROBIC AND ANAEROBIC  0.5CC  Final   Culture NO GROWTH 4 DAYS  Final   Report Status PENDING  Incomplete  Rapid Influenza A&B Antigens ([place] only)     Status: None   Collection Time: 08/21/15  7:23 PM  Result Value Ref Range Status   Influenza A ([facility]) NEGATIVE NEGATIVE Final   Influenza B ([facility]) NEGATIVE NEGATIVE Final    Coagulation Studies: No results for input(s): LABPROT,  INR in the last 72 hours.  Urinalysis:  Recent Labs Lab  08/24/15 1636  COLORURINE AMBER*  LABSPEC 1.016  PHURINE 5.0  GLUCOSEU NEGATIVE  HGBUR 1+*  BILIRUBINUR NEGATIVE  KETONESUR NEGATIVE  PROTEINUR 30*  NITRITE NEGATIVE  LEUKOCYTESUR 3+*    Lipid Panel:     Component Value Date/Time   CHOL 83 08/24/2015 2345   TRIG 97 08/24/2015 2345   HDL 10* 08/24/2015 2345   CHOLHDL 8.3 08/24/2015 2345   VLDL 19 08/24/2015 2345   LDLCALC 54 08/24/2015 2345    HgbA1C: No results found for: HGBA1C  Urine Drug Screen:  No results found for: LABOPIA, COCAINSCRNUR, LABBENZ, AMPHETMU, THCU, LABBARB  Alcohol Level: No results for input(s): ETH in the last 168 hours.  Other results: EKG: normal sinus rhythm at 86 bpm with premature supraventricular complexes.  Imaging: Dg Abd 1 View  08/25/2015  CLINICAL DATA:  MR screening EXAM: ABDOMEN - 1 VIEW COMPARISON:  11/10/2013 FINDINGS: BILATERAL hip prostheses. Bones demineralized. RIGHT pelvic phlebolith. Clustered nonobstructing RIGHT renal calculi 14 x 9 mm. Clothing artifacts from bra clips. No other radiopaque foreign bodies identified. IMPRESSION: Nonobstructing RIGHT renal calculi. BILATERAL hip prostheses. Electronically Signed   By: Lavonia Dana M.D.   On: 08/25/2015 11:15   Ct Head Wo Contrast  08/24/2015  CLINICAL DATA:  Slurred speech.  Weakness and confusion for 3 days. EXAM: CT HEAD WITHOUT CONTRAST TECHNIQUE: Contiguous axial images were obtained from the base of the skull through the vertex without intravenous contrast. COMPARISON:  09/27/2011 PET FINDINGS: Sinuses/Soft tissues: No significant soft tissue swelling. Clear paranasal sinuses and mastoid air cells. Hyperostosis frontalis interna. Intracranial: Mild to moderate low density in the periventricular white matter likely related to small vessel disease. Cerebral and cerebellar volume loss is than normal limits for age. No mass lesion, hemorrhage, hydrocephalus, acute infarct, intra-axial, or extra-axial fluid collection. IMPRESSION: 1.   No acute intracranial abnormality. 2.  Cerebral atrophy and small vessel ischemic change. Electronically Signed   By: Abigail Miyamoto M.D.   On: 08/24/2015 14:52   Mr Brain Wo Contrast  08/25/2015  CLINICAL DATA:  New onset of slurred speech and altered mental status since yesterday. Memory loss above baseline. EXAM: MRI HEAD WITHOUT CONTRAST TECHNIQUE: Multiplanar, multiecho pulse sequences of the brain and surrounding structures were obtained without intravenous contrast. COMPARISON:  CT head without contrast 08/24/2015 FINDINGS: Diffusion-weighted images demonstrate no evidence for acute or subacute infarction. Mild to moderate periventricular and subcortical white matter changes are present bilaterally. There is moderate generalized atrophy. Dilated perivascular spaces are noted in the basal ganglia bilaterally, more prominent on the left. Minimal white matter changes extend into the brainstem. The cerebellum is unremarkable. The internal auditory canals are normal. Flow is present in the major intracranial arteries. Bilateral lens replacements are present. The globes and orbits are otherwise intact. The paranasal sinuses and mastoid air cells are clear. The skullbase is within normal limits. Midline sagittal images are unremarkable. IMPRESSION: 1. No acute abnormality. 2. Mild moderate diffuse periventricular and subcortical white matter disease and moderate generalized atrophy. This is somewhat advanced for age and likely reflects the sequela of chronic microvascular ischemia. Electronically Signed   By: San Morelle M.D.   On: 08/25/2015 11:51   US Carotid Bilateral  08/25/2015  CLINICAL DATA:  82 year old female with symptoms of stroke EXAM: BILATERAL CAROTID DUPLEX ULTRASOUND TECHNIQUE: Pearline Cables scale imaging, color Doppler and duplex ultrasound were performed of bilateral carotid and vertebral arteries in the neck.  COMPARISON:  Head CT 08/23/2012 FINDINGS: Criteria: Quantification of carotid  stenosis is based on velocity parameters that correlate the residual internal carotid diameter with NASCET-based stenosis levels, using the diameter of the distal internal carotid lumen as the denominator for stenosis measurement. The following velocity measurements were obtained: RIGHT ICA:  59/11 cm/sec CCA:  [id number] cm/sec SYSTOLIC ICA/CCA RATIO:  0.8 DIASTOLIC ICA/CCA RATIO:  1.6 ECA:  48 cm/sec LEFT ICA:  60/11 cm/sec CCA:  [id number] cm/sec SYSTOLIC ICA/CCA RATIO:  0.8 DIASTOLIC ICA/CCA RATIO:  1.7 ECA:  32 cm/sec RIGHT CAROTID ARTERY: Intimal medial thickening. Trace atherosclerotic plaque in the proximal internal carotid artery without evidence of luminal narrowing. RIGHT VERTEBRAL ARTERY:  Patent with normal antegrade flow. LEFT CAROTID ARTERY: Trace focal heterogeneous atherosclerotic plaque in the proximal internal carotid artery. By peak systemic velocity criteria the estimated stenosis remains less than 50%. LEFT VERTEBRAL ARTERY:  Patent with normal antegrade flow. IMPRESSION: 1. Mild (1-49%) stenosis proximal left internal carotid artery secondary to mild focal heterogeneous atherosclerotic plaque. 2. Trace atherosclerotic plaque on the right without evidence of stenosis. 3. Vertebral arteries are patent with normal antegrade flow. Signed, Criselda Peaches, MD [facility] Electronically Signed   By: Jacqulynn Cadet M.D.   On: 08/25/2015 10:18     Assessment/Plan: 82 year old female presenting with slurred speech and confusion.  Family feels speech at baseline at this time.  MRI of the brain personally reviewed and shows no acute changes.  Neurological examination is nonfocal.  Lab work shows a mildly increased white blood cell count, anemia and renal insufficiency.  Metabolic abnormalities in clinical setting of recent infection likely cause for presentation.  Do not suspect stroke or TIA.    Recommendations: 1.  Agree with addressing  metabolic issues. 2.  Further stroke work up not indicated.    Alexis Goodell, MD Neurology [phone] 08/25/2015, 11:56 AM

## 2015-08-25 NOTE — Progress Notes (Signed)
[facility] [place] NAME: Janet Pope    MR#:  [id number]  DATE OF BIRTH:  1926-10-13  SUBJECTIVE:  CHIEF COMPLAINT: pt is sleepy but arousable. Denies any dysarthria or lower extremity weakness. Sister at bedside  REVIEW OF SYSTEMS:  CONSTITUTIONAL: No fever, fatigue or weakness.  EYES: No blurred or double vision.  EARS, NOSE, AND THROAT: No tinnitus or ear pain.  RESPIRATORY: No cough, shortness of breath, wheezing or hemoptysis.  CARDIOVASCULAR: No chest pain, orthopnea, edema.  GASTROINTESTINAL: No nausea, vomiting, diarrhea or abdominal pain.  GENITOURINARY: No dysuria, hematuria.  ENDOCRINE: No polyuria, nocturia,  HEMATOLOGY: No anemia, easy bruising or bleeding SKIN: No rash or lesion. MUSCULOSKELETAL: No joint pain or arthritis.   NEUROLOGIC: No tingling, numbness, weakness.  PSYCHIATRY: No anxiety or depression.   DRUG ALLERGIES:   Allergies  Allergen Reactions  . Aspirin Shortness Of Breath  . Ace Inhibitors Other (See Comments)    Reaction:  Unknown   . Eggs Or Egg-Derived Products Other (See Comments)    Reaction:  Unknown   . Milk-Related Compounds Other (See Comments)    Reaction:  Unknown   . Whey Other (See Comments)    Reaction:  Unknown     VITALS:  Blood pressure 131/45, pulse 83, temperature 98.5 F (36.9 C), temperature source Oral, resp. rate 20, height 5\' 3"  (1.6 m), weight 70.353 kg (155 lb 1.6 oz), SpO2 100 %.  PHYSICAL EXAMINATION:  GENERAL:  82 y.o.-year-old patient lying in the bed with no acute distress.  EYES: Pupils equal, round, reactive to light and accommodation. No scleral icterus. Extraocular muscles intact.  HEENT: Head atraumatic, normocephalic. Oropharynx and nasopharynx clear.  NECK:  Supple, no jugular venous distention. No thyroid enlargement, no tenderness.  LUNGS: Normal breath sounds bilaterally, no wheezing, rales,rhonchi or crepitation. No use of accessory muscles of  respiration.  CARDIOVASCULAR: S1, S2 normal. No murmurs, rubs, or gallops.  ABDOMEN: Soft, nontender, nondistended. Bowel sounds present. No organomegaly or mass.  EXTREMITIES: No pedal edema, cyanosis, or clubbing.  NEUROLOGIC: Lethargic but arousable Cranial nerves II through XII are grossly intact. Muscle strength 5/5 in all extremities. No cerebellar signs. Sensation intact. Gait not checked.  PSYCHIATRIC: The patient is alert and oriented x 3.  SKIN: No obvious rash, lesion, or ulcer.    LABORATORY PANEL:   CBC  Recent Labs Lab 08/24/15 2345  08/25/15 0529  WBC 11.0  --   --   HGB 3.9*  < > 8.2*  HCT 12.5*  --  24.7*  PLT 354  --   --   < > = values in this interval not displayed. ------------------------------------------------------------------------------------------------------------------  Chemistries   Recent Labs Lab 08/24/15 1445 08/24/15 2345  NA 135  --   K 3.4*  --   CL 102  --   CO2 25  --   GLUCOSE 114*  --   BUN 35*  --   CREATININE 2.04* 1.89*  CALCIUM 10.1  --   AST 10*  --   ALT 11*  --   ALKPHOS 94  --   BILITOT 0.9  --    ------------------------------------------------------------------------------------------------------------------  Cardiac Enzymes  Recent Labs Lab 08/24/15 1445  TROPONINI <0.03   ------------------------------------------------------------------------------------------------------------------  RADIOLOGY:  Dg Abd 1 View  08/25/2015  CLINICAL DATA:  MR screening EXAM: ABDOMEN - 1 VIEW COMPARISON:  11/10/2013 FINDINGS: BILATERAL hip prostheses. Bones demineralized. RIGHT pelvic phlebolith. Clustered nonobstructing RIGHT renal calculi 14 x 9  mm. Clothing artifacts from bra clips. No other radiopaque foreign bodies identified. IMPRESSION: Nonobstructing RIGHT renal calculi. BILATERAL hip prostheses. Electronically Signed   By: Lavonia Dana M.D.   On: 08/25/2015 11:15   Ct Head Wo Contrast  08/24/2015  CLINICAL DATA:   Slurred speech.  Weakness and confusion for 3 days. EXAM: CT HEAD WITHOUT CONTRAST TECHNIQUE: Contiguous axial images were obtained from the base of the skull through the vertex without intravenous contrast. COMPARISON:  09/27/2011 PET FINDINGS: Sinuses/Soft tissues: No significant soft tissue swelling. Clear paranasal sinuses and mastoid air cells. Hyperostosis frontalis interna. Intracranial: Mild to moderate low density in the periventricular white matter likely related to small vessel disease. Cerebral and cerebellar volume loss is than normal limits for age. No mass lesion, hemorrhage, hydrocephalus, acute infarct, intra-axial, or extra-axial fluid collection. IMPRESSION: 1.  No acute intracranial abnormality. 2.  Cerebral atrophy and small vessel ischemic change. Electronically Signed   By: Abigail Miyamoto M.D.   On: 08/24/2015 14:52   Mr Brain Wo Contrast  08/25/2015  CLINICAL DATA:  New onset of slurred speech and altered mental status since yesterday. Memory loss above baseline. EXAM: MRI HEAD WITHOUT CONTRAST TECHNIQUE: Multiplanar, multiecho pulse sequences of the brain and surrounding structures were obtained without intravenous contrast. COMPARISON:  CT head without contrast 08/24/2015 FINDINGS: Diffusion-weighted images demonstrate no evidence for acute or subacute infarction. Mild to moderate periventricular and subcortical white matter changes are present bilaterally. There is moderate generalized atrophy. Dilated perivascular spaces are noted in the basal ganglia bilaterally, more prominent on the left. Minimal white matter changes extend into the brainstem. The cerebellum is unremarkable. The internal auditory canals are normal. Flow is present in the major intracranial arteries. Bilateral lens replacements are present. The globes and orbits are otherwise intact. The paranasal sinuses and mastoid air cells are clear. The skullbase is within normal limits. Midline sagittal images are unremarkable.  IMPRESSION: 1. No acute abnormality. 2. Mild moderate diffuse periventricular and subcortical white matter disease and moderate generalized atrophy. This is somewhat advanced for age and likely reflects the sequela of chronic microvascular ischemia. Electronically Signed   By: San Morelle M.D.   On: 08/25/2015 11:51   US Carotid Bilateral  08/25/2015  CLINICAL DATA:  82 year old female with symptoms of stroke EXAM: BILATERAL CAROTID DUPLEX ULTRASOUND TECHNIQUE: Pearline Cables scale imaging, color Doppler and duplex ultrasound were performed of bilateral carotid and vertebral arteries in the neck. COMPARISON:  Head CT 08/23/2012 FINDINGS: Criteria: Quantification of carotid stenosis is based on velocity parameters that correlate the residual internal carotid diameter with NASCET-based stenosis levels, using the diameter of the distal internal carotid lumen as the denominator for stenosis measurement. The following velocity measurements were obtained: RIGHT ICA:  59/11 cm/sec CCA:  [id number] cm/sec SYSTOLIC ICA/CCA RATIO:  0.8 DIASTOLIC ICA/CCA RATIO:  1.6 ECA:  48 cm/sec LEFT ICA:  60/11 cm/sec CCA:  [id number] cm/sec SYSTOLIC ICA/CCA RATIO:  0.8 DIASTOLIC ICA/CCA RATIO:  1.7 ECA:  32 cm/sec RIGHT CAROTID ARTERY: Intimal medial thickening. Trace atherosclerotic plaque in the proximal internal carotid artery without evidence of luminal narrowing. RIGHT VERTEBRAL ARTERY:  Patent with normal antegrade flow. LEFT CAROTID ARTERY: Trace focal heterogeneous atherosclerotic plaque in the proximal internal carotid artery. By peak systemic velocity criteria the estimated stenosis remains less than 50%. LEFT VERTEBRAL ARTERY:  Patent with normal antegrade flow. IMPRESSION: 1. Mild (1-49%) stenosis proximal left internal carotid artery secondary to mild focal heterogeneous atherosclerotic plaque. 2. Trace atherosclerotic plaque on the right  without evidence of stenosis. 3. Vertebral arteries are patent with normal antegrade flow. Signed,  Criselda Peaches, MD [facility] Electronically Signed   By: Jacqulynn Cadet M.D.   On: 08/25/2015 10:18    EKG:   Orders placed or performed during the hospital encounter of 08/24/15  . ED EKG  . ED EKG    ASSESSMENT AND PLAN:    82 year old female with a history of CLL and hyperlipidemia who presents with generalized weakness and slurred speech since yesterday.  1. Altered mental status could be from acute renal failure from dehydration Clinically improving:  CT head and MRI of the brain are negative   echo and carotid Doppler done report pending  Continue neuro checks.  PT consult and case management consult. No further stroke workup needed per neurology   2. Acute on chronic anemia: Order anemia panel. Transfuse 1 unit PRBCs Hemoglobin -8.2   3. Acute renal failure: This is likely due to poor by mouth intake. On IV fluids and recheck BMP in a.m. 2.04--1.89 If creatinine still elevated then I would consider ordering a renal ultrasound for further evaluation. Monitor input and output. Hold nephrotoxic agents including ibuprofen.  4. History of hypertension with hypotension: Discontinue Norvasc and Coreg. Transfuse 1 unit of PRBC and provide IV fluid. In 2 to monitor blood pressure.  5. Recent cough and fever: Continue Levaquin. Can discontinue on March 16.  6. Hyperlipidemia: LDL 54 continue simvastatin.  PT consult pending   All the records are reviewed and case discussed with Care Management/Social Workerr. Management plans discussed with the patient, family and they are in agreement.  CODE STATUS: fc  TOTAL TIME TAKING CARE OF THIS PATIENT: 35  minutes.   POSSIBLE D/C IN 1-2 DAYS, DEPENDING ON CLINICAL CONDITION.   Nicholes Mango M.D on 08/25/2015 at 3:40 PM  Between 7am to 6pm - Pager - [phone] After 6pm go to [email or web address] - password [email or web address] [facility]  Office   [phone]  CC: Primary care physician; Pcp Not In System

## 2015-08-25 NOTE — Progress Notes (Signed)
2102 Lab was unable to draw labs peripherally.  Pt sent to [place].  Pt returned 2140 and called lab to return for type and cross.  2232 BP 105/41 and lab unable to get blood peripherally. Spoke with Dr Jannifer Franklin and aware of delay and vs.  Called ER and a nurse came to draw labs.  0035 blood available and call received from lab that Hgb is 3.9, plat 12.5.  Brought blood up and spoke with Dr Jannifer Franklin at [phone].  Ordered stat Hgb.  Returned blood.  0130 lab called with Hgb 6.4 and notified Dr Jannifer Franklin. Ok to start blood.  Will return to blood bank for 1 unit. Dorna Bloom RN

## 2015-08-25 NOTE — Care Management (Signed)
Admitted to [facility] with the diagnosis of stroke. Lives alone. Brother is Hospital doctor [phone]). Last seen Dr. William Hamburger yesterday. No home health. [facility] 2-3 years ago for skilled nursing/rehabilitation. Uses no aids for ambulation. No falls. Good appetite. Takes care of both basic and instrumental activities of daily living herself, drives. No Life Alert. Brother and niece, Mateo Flow, check on her all the time. Brother will transport. Shelbie Ammons RN MSN CCM Care Management [phone]

## 2015-08-25 NOTE — Progress Notes (Signed)
Pt pulled out port and pulled off telemetry leads, not following commands when getting up to the [facility]. 2 plus assist, very weak. Upon assessment, pt very lethargic, unable to do an NIH scale, pt has moments of wakefulness and then falls back to sleep. Unable to take pills. MD notified, ordered urine culture and antibiotics.

## 2015-08-25 NOTE — Progress Notes (Signed)
Speech Therapy Note: received order, reviewed chart notes and consulted NSG then met w/ pt. Sister in room. Pt was sleepy after eating her lunch meal; pt denied any difficulty swallowing and also denied any speech-language deficits. Pt conversed somewhat w/ SLP indicating she was "fine" and that she was going to rest at this time. ST will f/u in AM for any further assessment needs. NSG and pt/Sister agreed.

## 2015-08-25 NOTE — Progress Notes (Signed)
PT Cancellation Note  Patient Details Name: Janet Pope MRN: [id number] DOB: Jun 12, 1927   Cancelled Treatment:    Reason Eval/Treat Not Completed: Patient at procedure or test/unavailable. As PT arrived, pt was being prepared for transferring to get an [place] and is not available for PT evaluation. PT will f/u and complete evaluation when pt is appropriate and available.   Neoma Laming, PT, DPT  08/25/2015, 10:28 AM [phone]

## 2015-08-26 LAB — RETICULOCYTES

## 2015-08-26 LAB — BASIC METABOLIC PANEL
ANION GAP: 10 (ref 5–15)
BUN: 25 mg/dL — AB (ref 6–20)
CALCIUM: 9.3 mg/dL (ref 8.9–10.3)
CHLORIDE: 108 mmol/L (ref 101–111)
CO2: 21 mmol/L — AB (ref 22–32)
Creatinine, Ser: 1.4 mg/dL — ABNORMAL HIGH (ref 0.44–1.00)
GFR, EST AFRICAN AMERICAN: 38 mL/min — AB (ref >60)
GFR, EST NON AFRICAN AMERICAN: 33 mL/min — AB (ref >60)
GLUCOSE: 100 mg/dL — AB (ref 65–99)
POTASSIUM: 3.2 mmol/L — AB (ref 3.5–5.1)
Sodium: 139 mmol/L (ref 135–145)

## 2015-08-26 LAB — CBC
HEMATOCRIT: 26.5 % — AB (ref 35.0–47.0)
HEMOGLOBIN: 8.9 g/dL — AB (ref 12.0–16.0)
MCH: 25.8 pg — ABNORMAL LOW (ref 26.0–34.0)
MCHC: 33.7 g/dL (ref 32.0–36.0)
MCV: 76.5 fL — AB (ref 80.0–100.0)
Platelets: 298 10*3/uL (ref 150–440)
RBC: 3.46 MIL/uL — AB (ref 3.80–5.20)
RDW: 18.3 % — ABNORMAL HIGH (ref 11.5–14.5)
WBC: 9.5 10*3/uL (ref 3.6–11.0)

## 2015-08-26 LAB — LIPID PANEL
CHOLESTEROL: 80 mg/dL (ref 0–200)
Triglycerides: 94 mg/dL (ref <150)

## 2015-08-26 LAB — CULTURE, BLOOD (ROUTINE X 2)
CULTURE: NO GROWTH
Culture: NO GROWTH

## 2015-08-26 MED ORDER — SENNOSIDES-DOCUSATE SODIUM 8.6-50 MG PO TABS: 1.0000 | ORAL_TABLET | Freq: Every evening | ORAL | Status: DC | PRN

## 2015-08-26 MED ORDER — HYDROCODONE-ACETAMINOPHEN 5-325 MG PO TABS: 1.0000 | ORAL_TABLET | ORAL | Status: DC | PRN

## 2015-08-26 MED ORDER — POTASSIUM CHLORIDE 20 MEQ PO PACK
40.0000 meq | PACK | Freq: Once | ORAL | Status: AC
  Administered 2015-08-26: 14:00:00 40 meq via ORAL
  Filled 2015-08-26: qty 2

## 2015-08-26 MED ORDER — DEXTROSE 5 % IV SOLN
1.0000 g | INTRAVENOUS | Status: DC
  Administered 2015-08-26: 20:00:00 1 g via INTRAVENOUS
  Filled 2015-08-26 (×2): qty 10

## 2015-08-26 MED ORDER — ACETAMINOPHEN 325 MG PO TABS: 650.0000 mg | ORAL_TABLET | Freq: Four times a day (QID) | ORAL | Status: DC | PRN

## 2015-08-26 MED ORDER — ASPIRIN EC 81 MG PO TBEC: 81.0000 mg | DELAYED_RELEASE_TABLET | Freq: Every day | ORAL | Status: DC

## 2015-08-26 NOTE — Progress Notes (Signed)
IV fluids appear to not have been running during the day, 3/15. NS @ 75 ml/hr restarted 0129, continue to assess.

## 2015-08-26 NOTE — NC FL2 (Signed)
[place] LEVEL OF CARE SCREENING TOOL     IDENTIFICATION  Patient Name: Janet Pope Birthdate: [birth year] Sex: female Admission Date (Current Location): 08/24/2015  [facility] and [place] Number:      Facility and Address:  [facility], [address] [place], [place] [address]      Provider Number: [id number]  Attending Physician Name and Address:  Nicholes Mango, MD  Relative Name and Phone Number:       Current Level of Care: Hospital Recommended Level of Care: [facility] [place] Prior Approval Number:    Date Approved/Denied:   PASRR Number:  ([id number])  Discharge Plan: SNF    Current Diagnoses: Patient Active Problem List   Diagnosis Date Noted  . Stroke (cerebrum) ([place]) 08/24/2015  . Gout   . Mitral stenosis 10/03/2012  . Palpitations 10/03/2012  . Dyspnea 08/02/2012  . HTN (hypertension)     Orientation RESPIRATION BLADDER Height & Weight     Self  Normal Incontinent Weight: 155 lb 1.6 oz (70.353 kg) Height:  5\' 3"  (160 cm)  BEHAVIORAL SYMPTOMS/MOOD NEUROLOGICAL BOWEL NUTRITION STATUS   (None)  (None) Incontinent Diet (Regular )  AMBULATORY STATUS COMMUNICATION OF NEEDS Skin   Extensive Assist Verbally Normal                       Personal Care Assistance Level of Assistance  Feeding, Bathing, Dressing Bathing Assistance: Limited assistance Feeding assistance: Independent Dressing Assistance: Limited assistance     Functional Limitations Info  Sight, Hearing, Speech Sight Info: Adequate Hearing Info: Adequate Speech Info: Adequate    SPECIAL CARE FACTORS FREQUENCY  PT (By licensed PT)     PT Frequency:  (5)              Contractures      Additional Factors Info  Code Status, Allergies Code Status Info:  (Partial- DO NOT Perform Intubation/Mechanical Ventilation) Allergies Info:  (Aspirin, Ace Inhibitors, Eggs Or Egg-derived Products, Milk-related Compounds, Whey)            Current Medications (08/26/2015):  This is the current hospital active medication list Current Facility-Administered Medications  Medication Dose Route Frequency Provider Last Rate Last Dose  . 0.9 %  sodium chloride infusion   Intravenous Continuous Bettey Costa, MD 75 mL/hr at 08/26/15 1530    . 0.9 %  sodium chloride infusion   Intravenous Once Sital Mody, MD      . 0.9 %  sodium chloride infusion   Intravenous Once Bettey Costa, MD      . acetaminophen (TYLENOL) tablet 650 mg  650 mg Oral Q6H PRN Bettey Costa, MD       Or  . acetaminophen (TYLENOL) suppository 650 mg  650 mg Rectal Q6H PRN Bettey Costa, MD      . alum & mag hydroxide-simeth (MAALOX/MYLANTA) 200-200-20 MG/5ML suspension 30 mL  30 mL Oral Q6H PRN Bettey Costa, MD      . aspirin EC tablet 325 mg  325 mg Oral Daily Sital Mody, MD   325 mg at 08/26/15 1019  . baclofen (LIORESAL) tablet 10 mg  10 mg Oral TID Bettey Costa, MD   10 mg at 08/26/15 1019  . bisacodyl (DULCOLAX) EC tablet 5 mg  5 mg Oral Daily PRN Bettey Costa, MD   5 mg at 08/26/15 1019  . cefTRIAXone (ROCEPHIN) 1 g in dextrose 5 % 50 mL IVPB  1 g Intravenous Q24H Charlett Nose,  RPH      . chlorpheniramine-HYDROcodone (TUSSIONEX) 10-8 MG/5ML suspension 5 mL  5 mL Oral BID Bettey Costa, MD   5 mL at 08/26/15 1020  . enoxaparin (LOVENOX) injection 30 mg  30 mg Subcutaneous Q24H Bettey Costa, MD   30 mg at 08/25/15 2159  . HYDROcodone-acetaminophen (NORCO/VICODIN) 5-325 MG per tablet 1-2 tablet  1-2 tablet Oral Q4H PRN Sital Mody, MD      . latanoprost (XALATAN) 0.005 % ophthalmic solution 1 drop  1 drop Both Eyes QHS Bettey Costa, MD   1 drop at 08/25/15 2159  . ondansetron (ZOFRAN) tablet 4 mg  4 mg Oral Q6H PRN Bettey Costa, MD       Or  . ondansetron (ZOFRAN) injection 4 mg  4 mg Intravenous Q6H PRN Bettey Costa, MD      . oxybutynin (DITROPAN) tablet 5 mg  5 mg Oral BID Bettey Costa, MD   5 mg at 08/26/15 1019  . pantoprazole (PROTONIX) EC tablet 40 mg  40 mg Oral Daily Bettey Costa, MD    40 mg at 08/26/15 1019  . senna-docusate (Senokot-S) tablet 1 tablet  1 tablet Oral QHS PRN Bettey Costa, MD      . simvastatin (ZOCOR) tablet 20 mg  20 mg Oral Daily Sital Mody, MD   20 mg at 08/26/15 1019  . sodium chloride flush (NS) 0.9 % injection 3 mL  3 mL Intravenous Q12H Bettey Costa, MD   3 mL at 08/26/15 0936  . timolol (TIMOPTIC) 0.5 % ophthalmic solution 1 drop  1 drop Both Eyes Daily Bettey Costa, MD   1 drop at 08/26/15 0935     Discharge Medications: Please see discharge summary for a list of discharge medications.  Relevant Imaging Results:  Relevant Lab Results:   Additional Information  (SSN [id number])  Lorenso Quarry Sunkins, LCSW

## 2015-08-26 NOTE — Evaluation (Signed)
Clinical/Bedside Swallow Evaluation Patient Details  Name: Janet Pope MRN: [id number] Date of Birth: 09-Mar-1927  Today's Date: 08/26/2015 Time: SLP Start Time (ACUTE ONLY): 0900 SLP Stop Time (ACUTE ONLY): 1000 SLP Time Calculation (min) (ACUTE ONLY): 60 min  Past Medical History:  Past Medical History  Diagnosis Date  . HLD (hyperlipidemia)   . GERD (gastroesophageal reflux disease)   . Kidney stone   . H/O lumpectomy   . Obstructive sleep apnea   . Gout   . Diverticulosis   . Nephrolithiasis     history of right lithotripsy  . History of valvular heart disease   . HTN (hypertension)   . Breast cancer ([place])   . CLL (chronic lymphocytic leukemia) ([place])   . Stomach cancer ([place])   . Gout   . CHF (congestive heart failure) ([place])   . Arthritis    Past Surgical History:  Past Surgical History  Procedure Laterality Date  . Total abdominal hysterectomy w/ bilateral salpingoophorectomy    . Breast lumpectomy    . Partial hip arthroplasty      right  . Cataract extraction, bilateral    . Lithotripsy      right  . Portacath placement    . Abdominal hysterectomy     HPI:  Pt is a 82 y.o. female with a known history of GERD, OSA, CHF, HTN, gout, stomach and breast Cas, hyperlipidemia and CLL who presents from her primary care physician office for following issue. Since yesterday patient has had slurred speech and not acting right associated with some memory loss. Family was concerned so patient was seen by her primary care physician. He sent her to the hospital due to anemia and slurred speech. She was seen in the emergency room on March 11 and diagnosed with cough and fever sent home on Tussionex and Levaquin. Pt's speech has resolved per MD notes at admission. Pt is currently on a regular diet. Pt lives alone at home.    Assessment / Plan / Recommendation Clinical Impression  Pt appeared to adequately tolerate trials of thin liquids and purees/soft solids w/ no overt s/s of  aspiration noted; clear vocal quality noted b/t trials and no decline in respiratory presentation noted. Oral phase appeared grossly wfl w/ min. increased mastication time noted w/ the solid textures; pt cleared appropriately b/t trials. Pt fed self w/ setup; noted myoclonic jerks of the UE when holding cup drinking. Pt appears at reduced risk for aspiration following general aspiration precautions w/ a mech soft/regular diet and thin liquids; may benefit from meds in puree if any difficulty swallowing w/ liquids. Of note, pt awake, verbally responsive to direct, basic questions. However, pt was oriented to self and city only; described this hospital as [facility] and gave the year as "7". She followed basic, one-step commands w/ cues and demo deficits in speech and task initiation; she did not initiate any verbal interaction and answered "I'm fine" often. Reviewed MRI results indicating mild-moderate diffuse periventricular and subcortical white matter disease and moderate generalized atrophy. This is somewhat advanced for age and likely reflects the sequela of chronic microvascular ischemia. These results could be related to pt's overall Cognitive presentation and f/u w/ Neurology for further Cognitive assessment is warranted as pt does live alone and safety needs to be assured. Pt would benefit from f/u w/ ST services for toleratoin of diet and education on aspiration precautions to ensure safety w/ diet while admitted.     Aspiration Risk   (reduced  following precautions)    Diet Recommendation  regular/mech soft diet w/ thin liquids; aspiration precautions, Reflux precautions; tray setup at meals.  Medication Administration: Whole meds with liquid (or puree if indicated)    Other  Recommendations Recommended Consults:  (Neurological assessment to r/o potential decline, Dementia) Oral Care Recommendations: Oral care BID;Staff/trained caregiver to provide oral care   Follow up Recommendations  None     Frequency and Duration min 2x/week  2 weeks       Prognosis Prognosis for Safe Diet Advancement: Good Barriers to Reach Goals: Cognitive deficits (TBD)      Swallow Study   General Date of Onset: 08/26/15 HPI: Pt is a 82 y.o. female with a known history of GERD, OSA, CHF, HTN, gout, stomach and breast Cas, hyperlipidemia and CLL who presents from her primary care physician office for following issue. Since yesterday patient has had slurred speech and not acting right associated with some memory loss. Family was concerned so patient was seen by her primary care physician. He sent her to the hospital due to anemia and slurred speech. She was seen in the emergency room on March 11 and diagnosed with cough and fever sent home on Tussionex and Levaquin. Pt's speech has resolved per MD notes at admission. Pt is currently on a regular diet. Pt lives alone at home.  Type of Study: Bedside Swallow Evaluation Previous Swallow Assessment: none Diet Prior to this Study: Regular;Thin liquids Temperature Spikes Noted: No Respiratory Status: Room air (wbc not elevated) History of Recent Intubation: No Behavior/Cognition: Cooperative;Pleasant mood;Distractible;Requires cueing (awake) Oral Cavity Assessment: Within Functional Limits Oral Care Completed by SLP: Yes Oral Cavity - Dentition: Adequate natural dentition Vision: Functional for self-feeding Self-Feeding Abilities: Able to feed self;Needs assist;Needs set up Patient Positioning: Upright in bed Baseline Vocal Quality: Normal;Low vocal intensity Volitional Cough: Strong Volitional Swallow: Able to elicit    Oral/Motor/Sensory Function Overall Oral Motor/Sensory Function: Within functional limits   Ice Chips Ice chips: Within functional limits Presentation: Spoon (fed; 2 trials)   Thin Liquid Thin Liquid: Within functional limits Presentation: Cup;Self Fed (~4-5 ozs totale w/ water and coffee) Other Comments: noted myoclonic jerks of the UE  holding the cup to drink    Nectar Thick Nectar Thick Liquid: Not tested   Honey Thick Honey Thick Liquid: Not tested   Puree Puree: Within functional limits Presentation: Self Fed;Spoon (3-4 trials)   Solid   GO   Solid: Within functional limits Presentation: Self Fed;Spoon (7-8 trials of potatoes and bacon)       Orinda Kenner, MS, CCC-SLP  Watson,Katherine 08/26/2015,2:41 PM

## 2015-08-26 NOTE — Discharge Instructions (Signed)
Activity as recommended by physical therapy Diet-mechanical soft with thin liquids Follow-up with primary care physician at the facility in 3 days Follow-up with nephrology in 2 weeks as needed

## 2015-08-26 NOTE — Evaluation (Signed)
Physical Therapy Evaluation Patient Details Name: Janet Pope MRN: [id number] DOB: September 25, 1926 Today's Date: 08/26/2015   History of Present Illness  presented to ER secondary to slurred speech, AMS; admitted for TIA vs. CVA work-up (all imaging negative at this time).  Per neurology, presentation likely related to metabolic abnormalities in the setting of recent infection.  also noted with low hemoglobin upon admission (7.2), status post 1 unit PRBC; HgB now 8.9  Clinical Impression  Upon evaualation, patient alert and oriented to self, location only; inconsistently follows simple, one-step commands (40%) and demonstrates significant deficits in task initiation, processing and sequencing.  Unable to initiate any functional activity without hand-over-hand assist from therapist; unable to problem simple needs or emergency procedures required for safe return home.  Bilat UE/LE strength and ROM appear grossly at least 3+/5; difficult to test with resistance due to impaired cognition.  Denies sensory deficit to L hemi-body, though question mild degree of L inattention at times.  Intermittent myoclonic jerking noted throughout extremities with occasional buckling of bilat knees with standing/mobility efforts (requiring mod assist from therapist to recover).  All sit/stand, basic transfers and short-distance gait requiring L HHA, mod asist +1; poor balance and righting reactions.  Improved to min/mod assist with use of RW, though requires direct physical assist from therapist to negotiate and advance RW for appropriate mobility. Would benefit from skilled PT to address above deficits and promote optimal return to PLOF; recommend transition to STR upon discharge from acute hospitalization, as patient unsafe/unable to return home alone at this time.  Recommendations and patient safety/mobility concerns discussed with care management and primary RN.    Follow Up Recommendations SNF;Supervision/Assistance - 24  hour (patient unsafe for return home alone at this time)    Equipment Recommendations  Rolling walker with 5" wheels    Recommendations for Other Services       Precautions / Restrictions Precautions Precautions: Fall Restrictions Weight Bearing Restrictions: No      Mobility  Bed Mobility Overal bed mobility: Needs Assistance Bed Mobility: Supine to Sit     Supine to sit: Min assist     General bed mobility comments: unable to initiate movement transition without physical assist/cuing from therapist  Transfers Overall transfer level: Needs assistance   Transfers: Sit to/from Stand Sit to Stand: Mod assist         General transfer comment: sit/stand from bed surface, min/mod assist with L HHA.  Intermittent buckling/myoclonic jerking in bilat UEs with transition to upright.  Poor balnce reactions in all planes; high risk for falls without +1 assist at all times.  Ambulation/Gait Ambulation/Gait assistance: Mod assist Ambulation Distance (Feet): 4 Feet Assistive device: 1 person hand held assist       General Gait Details: step to gait pattern; very choppy, staggering steps requiring step by step cuing from therapist for task initiation and processing.  Poor balance; high fall risk.  Stairs            Wheelchair Mobility    Modified Rankin (Stroke Patients Only)       Balance Overall balance assessment: Needs assistance Sitting-balance support: No upper extremity supported;Feet supported Sitting balance-Leahy Scale: Good     Standing balance support: No upper extremity supported Standing balance-Leahy Scale: Poor                               Pertinent Vitals/Pain Pain Assessment: No/denies pain    Home  Living Family/patient expects to be discharged to:: Private residence Living Arrangements: Alone   Type of Home: House Home Access: Level entry     Home Layout: One level        Prior Function Level of Independence:  Independent         Comments: Indep with ADLs, household and community activities; + driving.     Hand Dominance        Extremity/Trunk Assessment   Upper Extremity Assessment: Generalized weakness (grossly 4-/5; question mild degree of inattention to L UE)           Lower Extremity Assessment: Generalized weakness (grossly at least 3+/5, unable to test with resistance due to cognitive status.  Denies sensory deficit; question mild inattention to L LE)         Communication   Communication: No difficulties  Cognition Arousal/Alertness: Awake/alert Behavior During Therapy: Flat affect Overall Cognitive Status: Impaired/Different from baseline (oriented to self, location only; inconsistently follows simple, one-step commands (40-50% time); poor task initiation, insight and awareness; highly distractible by external environment)                      General Comments      Exercises Other Exercises Other Exercises: Sit/stand x3 with L HHA, min/mod assist for balance/safety Other Exercises: Gait x8' with RW, min assist--hand-over-hand assist from therapist for walker management and advancement (to faciltate LE stepping).  very highly distracted by external environment; poor task sequencing/understanding.  Poor balance. Absent insight into deficits and need for assist.  Do recommend continued use of RW and +1 assist for all mobility.      Assessment/Plan    PT Assessment Patient needs continued PT services  PT Diagnosis Difficulty walking;Generalized weakness   PT Problem List Decreased strength;Decreased activity tolerance;Decreased balance;Decreased mobility;Decreased coordination;Decreased cognition;Decreased knowledge of use of DME;Decreased safety awareness;Decreased knowledge of precautions;Impaired sensation  PT Treatment Interventions DME instruction;Gait training;Stair training;Functional mobility training;Therapeutic activities;Therapeutic exercise;Balance  training;Cognitive remediation;Patient/family education;Neuromuscular re-education   PT Goals (Current goals can be found in the Care Plan section) Acute Rehab PT Goals PT Goal Formulation: Patient unable to participate in goal setting Time For Goal Achievement: 09/09/15 Potential to Achieve Goals: Good    Frequency Min 2X/week   Barriers to discharge Decreased caregiver support;Inaccessible home environment      Co-evaluation               End of Session Equipment Utilized During Treatment: Gait belt Activity Tolerance: Patient tolerated treatment well Patient left: in chair;with call bell/phone within reach;with chair alarm set;with family/visitor present Nurse Communication: Mobility status         Time: [id number] PT Time Calculation (min) (ACUTE ONLY): 26 min   Charges:   PT Evaluation $PT Eval Moderate Complexity: 1 Procedure PT Treatments $Gait Training: 8-22 mins   PT G Codes:        Jozeph Persing H. Owens Shark, PT, DPT, NCS 08/26/2015, 1:07 PM [phone]

## 2015-08-26 NOTE — Plan of Care (Signed)
Problem: Education: Goal: Knowledge of [facility] Education information/materials will improve Outcome: Not Progressing Education to family  Problem: Activity: Goal: Risk for activity intolerance will decrease Outcome: Progressing Pt saw today and  Put in chair. tol well. 2 assists to get oob

## 2015-08-26 NOTE — Care Management Important Message (Signed)
Important Message  Patient Details  Name: Janet Pope MRN: [id number] Date of Birth: 1926/12/02   Medicare Important Message Given:  Yes    Janet Pope 08/26/2015, 11:31 AM

## 2015-08-26 NOTE — Discharge Summary (Signed)
[facility] [place] NAME: Janet Pope    MR#:  [id number]  DATE OF BIRTH:  04-26-1927  DATE OF ADMISSION:  08/24/2015 ADMITTING PHYSICIAN: Bettey Costa, MD  DATE OF DISCHARGE: 08/26/2015  PRIMARY CARE PHYSICIAN: Pcp Not In System    ADMISSION DIAGNOSIS:  Stroke (cerebrum) ([place]) [I63.9] Transient cerebral ischemia, unspecified transient cerebral ischemia type [G45.9]  DISCHARGE DIAGNOSIS:  Dehydration Acute kidney injury Generalized weakness  SECONDARY DIAGNOSIS:   Past Medical History  Diagnosis Date  . HLD (hyperlipidemia)   . GERD (gastroesophageal reflux disease)   . Kidney stone   . H/O lumpectomy   . Obstructive sleep apnea   . Gout   . Diverticulosis   . Nephrolithiasis     history of right lithotripsy  . History of valvular heart disease   . HTN (hypertension)   . Breast cancer ([place])   . CLL (chronic lymphocytic leukemia) ([place])   . Stomach cancer ([place])   . Gout   . CHF (congestive heart failure) ([place])   . Arthritis     HOSPITAL COURSE:   82 year old female with a history of CLL and hyperlipidemia who presents with generalized weakness and slurred speech since yesterday.  1. Altered mental status could be from acute renal failure from dehydration Clinically improving:  CT head and MRI of the brain are negative  carotid Doppler -mild stenosis Echocardiogram-normal EF, no embolic source identified Continue neuro checks.  PT consult-recommending snf  case management consulted for placement to rehabilitation. No further stroke workup needed per neurology   2. Acute on chronic anemia: Order anemia panel. Transfused 1 unit PRBCs Hemoglobin -8.2   3. Acute renal failure: This is likely due to poor by mouth intake. Improved with IV fluids 2.04--1.89--1.40. Monitor input and output. Hold nephrotoxic agents including ibuprofen.  4. History of hypertension with hypotension: Discontinue Norvasc  and Coreg. Transfuse 1 unit of PRBC and provide IV fluid. In 2 to monitor blood pressure.  5. Recent cough and fever: Continue Levaquin. Can discontinue on March 16.  6. Hyperlipidemia: LDL 54 continue simvastatin.  PT consult-rehabilitation  DISCHARGE CONDITIONS:   fair  CONSULTS OBTAINED:  Treatment Team:  Alexis Goodell, MD   PROCEDURES none  DRUG ALLERGIES:   Allergies  Allergen Reactions  . Aspirin Shortness Of Breath  . Ace Inhibitors Other (See Comments)    Reaction:  Unknown   . Eggs Or Egg-Derived Products Other (See Comments)    Reaction:  Unknown   . Milk-Related Compounds Other (See Comments)    Reaction:  Unknown   . Whey Other (See Comments)    Reaction:  Unknown     DISCHARGE MEDICATIONS:   Current Discharge Medication List    START taking these medications   Details  acetaminophen (TYLENOL) 325 MG tablet Take 2 tablets (650 mg total) by mouth every 6 (six) hours as needed for mild pain (or Fever >/= 101).    aspirin EC 81 MG tablet Take 1 tablet (81 mg total) by mouth daily.    senna-docusate (SENOKOT-S) 8.6-50 MG tablet Take 1 tablet by mouth at bedtime as needed for mild constipation.      CONTINUE these medications which have CHANGED   Details  HYDROcodone-acetaminophen (NORCO) 5-325 MG tablet Take 1-2 tablets by mouth every 4 (four) hours as needed for moderate pain. Qty: 30 tablet, Refills: 0      CONTINUE these medications which have NOT CHANGED   Details  albuterol (PROVENTIL  HFA;VENTOLIN HFA) 108 (90 Base) MCG/ACT inhaler Inhale 1-2 puffs into the lungs every 6 (six) hours as needed for wheezing or shortness of breath.    baclofen (LIORESAL) 10 MG tablet Take 1 tablet (10 mg total) by mouth 3 (three) times daily. Qty: 30 tablet, Refills: 0    carvedilol (COREG) 25 MG tablet Take 25 mg by mouth 2 (two) times daily with a meal.     chlorpheniramine-HYDROcodone (TUSSIONEX PENNKINETIC ER) 10-8 MG/5ML SUER Take 5 mLs by mouth 2 (two)  times daily. Qty: 140 mL, Refills: 0    latanoprost (XALATAN) 0.005 % ophthalmic solution Place 1 drop into both eyes at bedtime. Reported on 08/25/2015    omeprazole (PRILOSEC) 20 MG capsule Take 20 mg by mouth daily.    oxybutynin (DITROPAN) 5 MG tablet Take 5 mg by mouth 3 (three) times daily.     simethicone (MYLICON) 0000000 MG chewable tablet Chew 125 mg by mouth every 6 (six) hours as needed for flatulence.    simvastatin (ZOCOR) 20 MG tablet Take 20 mg by mouth at bedtime.     timolol (BETIMOL) 0.5 % ophthalmic solution Place 1 drop into both eyes 2 (two) times daily.       STOP taking these medications     ibuprofen (ADVIL,MOTRIN) 600 MG tablet      levofloxacin (LEVAQUIN) 750 MG tablet      polyethylene glycol powder (GLYCOLAX/MIRALAX) powder      Vitamin D, Ergocalciferol, (DRISDOL) 50000 units CAPS capsule          DISCHARGE INSTRUCTIONS:   Activity as recommended by physical therapy Diet-mechanical soft with thin liquids Follow-up with primary care physician at the facility in 3 days Follow-up with nephrology in 2 weeks as needed   DIET:  Cardiac diet-mechanical soft with thin liquids  DISCHARGE CONDITION:  Fair  ACTIVITY:  Activity as tolerated  OXYGEN:  Home Oxygen: No.   Oxygen Delivery: room air  DISCHARGE LOCATION:  nursing home -rehabilitation  If you experience worsening of your admission symptoms, develop shortness of breath, life threatening emergency, suicidal or homicidal thoughts you must seek medical attention immediately by calling 911 or calling your MD immediately  if symptoms less severe.  You Must read complete instructions/literature along with all the possible adverse reactions/side effects for all the Medicines you take and that have been prescribed to you. Take any new Medicines after you have completely understood and accpet all the possible adverse reactions/side effects.   Please note  You were cared for by a hospitalist  during your hospital stay. If you have any questions about your discharge medications or the care you received while you were in the hospital after you are discharged, you can call the unit and asked to speak with the hospitalist on call if the hospitalist that took care of you is not available. Once you are discharged, your primary care physician will handle any further medical issues. Please note that NO REFILLS for any discharge medications will be authorized once you are discharged, as it is imperative that you return to your primary care physician (or establish a relationship with a primary care physician if you do not have one) for your aftercare needs so that they can reassess your need for medications and monitor your lab values.     Today  Chief Complaint  Patient presents with  . Aphasia   Patient is feeling fine. Denies any complaints. Weakness is better.  ROS:  CONSTITUTIONAL: Denies fevers, chills. Denies any  fatigue, weakness.  EYES: Denies blurry vision, double vision, eye pain. EARS, NOSE, THROAT: Denies tinnitus, ear pain, hearing loss. RESPIRATORY: Denies cough, wheeze, shortness of breath.  CARDIOVASCULAR: Denies chest pain, palpitations, edema.  GASTROINTESTINAL: Denies nausea, vomiting, diarrhea, abdominal pain. Denies bright red blood per rectum. GENITOURINARY: Denies dysuria, hematuria. ENDOCRINE: Denies nocturia or thyroid problems. HEMATOLOGIC AND LYMPHATIC: Denies easy bruising or bleeding. SKIN: Denies rash or lesion. MUSCULOSKELETAL: Denies pain in neck, back, shoulder, knees, hips or arthritic symptoms.  NEUROLOGIC: Denies paralysis, paresthesias.  PSYCHIATRIC: Denies anxiety or depressive symptoms.   VITAL SIGNS:  Blood pressure 129/41, pulse 95, temperature 98.2 F (36.8 C), temperature source Oral, resp. rate 20, height 5\' 3"  (1.6 m), weight 70.353 kg (155 lb 1.6 oz), SpO2 100 %.  I/O:  No intake or output data in the 24 hours ending 08/26/15  1347  PHYSICAL EXAMINATION:  GENERAL:  82 y.o.-year-old patient lying in the bed with no acute distress.  EYES: Pupils equal, round, reactive to light and accommodation. No scleral icterus. Extraocular muscles intact.  HEENT: Head atraumatic, normocephalic. Oropharynx and nasopharynx clear.  NECK:  Supple, no jugular venous distention. No thyroid enlargement, no tenderness.  LUNGS: Normal breath sounds bilaterally, no wheezing, rales,rhonchi or crepitation. No use of accessory muscles of respiration.  CARDIOVASCULAR: S1, S2 normal. No murmurs, rubs, or gallops.  ABDOMEN: Soft, non-tender, non-distended. Bowel sounds present. No organomegaly or mass.  EXTREMITIES: No pedal edema, cyanosis, or clubbing.  NEUROLOGIC: Cranial nerves II through XII are intact. Muscle strength 5/5 in all extremities. Sensation intact. Gait not checked.  PSYCHIATRIC: The patient is alert and oriented x 3.  SKIN: No obvious rash, lesion, or ulcer.   DATA REVIEW:   CBC  Recent Labs Lab 08/26/15 0411  WBC 9.5  HGB 8.9*  HCT 26.5*  PLT 298    Chemistries   Recent Labs Lab 08/24/15 1445  08/26/15 0411  NA 135  --  139  K 3.4*  --  3.2*  CL 102  --  108  CO2 25  --  21*  GLUCOSE 114*  --  100*  BUN 35*  --  25*  CREATININE 2.04*  < > 1.40*  CALCIUM 10.1  --  9.3  AST 10*  --   --   ALT 11*  --   --   ALKPHOS 94  --   --   BILITOT 0.9  --   --   < > = values in this interval not displayed.  Cardiac Enzymes  Recent Labs Lab 08/24/15 1445  TROPONINI <0.03    Microbiology Results  Results for orders placed or performed during the hospital encounter of 08/24/15  Urine culture     Status: None (Preliminary result)   Collection Time: 08/25/15  4:36 PM  Result Value Ref Range Status   Specimen Description URINE, RANDOM  Final   Special Requests NONE  Final   Culture NO GROWTH < 12 HOURS  Final   Report Status PENDING  Incomplete    RADIOLOGY:  Dg Abd 1 View  08/25/2015  CLINICAL DATA:   MR screening EXAM: ABDOMEN - 1 VIEW COMPARISON:  11/10/2013 FINDINGS: BILATERAL hip prostheses. Bones demineralized. RIGHT pelvic phlebolith. Clustered nonobstructing RIGHT renal calculi 14 x 9 mm. Clothing artifacts from bra clips. No other radiopaque foreign bodies identified. IMPRESSION: Nonobstructing RIGHT renal calculi. BILATERAL hip prostheses. Electronically Signed   By: Lavonia Dana M.D.   On: 08/25/2015 11:15   Ct Head Wo Contrast  08/24/2015  CLINICAL DATA:  Slurred speech.  Weakness and confusion for 3 days. EXAM: CT HEAD WITHOUT CONTRAST TECHNIQUE: Contiguous axial images were obtained from the base of the skull through the vertex without intravenous contrast. COMPARISON:  09/27/2011 PET FINDINGS: Sinuses/Soft tissues: No significant soft tissue swelling. Clear paranasal sinuses and mastoid air cells. Hyperostosis frontalis interna. Intracranial: Mild to moderate low density in the periventricular white matter likely related to small vessel disease. Cerebral and cerebellar volume loss is than normal limits for age. No mass lesion, hemorrhage, hydrocephalus, acute infarct, intra-axial, or extra-axial fluid collection. IMPRESSION: 1.  No acute intracranial abnormality. 2.  Cerebral atrophy and small vessel ischemic change. Electronically Signed   By: Abigail Miyamoto M.D.   On: 08/24/2015 14:52   Mr Brain Wo Contrast  08/25/2015  CLINICAL DATA:  New onset of slurred speech and altered mental status since yesterday. Memory loss above baseline. EXAM: MRI HEAD WITHOUT CONTRAST TECHNIQUE: Multiplanar, multiecho pulse sequences of the brain and surrounding structures were obtained without intravenous contrast. COMPARISON:  CT head without contrast 08/24/2015 FINDINGS: Diffusion-weighted images demonstrate no evidence for acute or subacute infarction. Mild to moderate periventricular and subcortical white matter changes are present bilaterally. There is moderate generalized atrophy. Dilated perivascular spaces  are noted in the basal ganglia bilaterally, more prominent on the left. Minimal white matter changes extend into the brainstem. The cerebellum is unremarkable. The internal auditory canals are normal. Flow is present in the major intracranial arteries. Bilateral lens replacements are present. The globes and orbits are otherwise intact. The paranasal sinuses and mastoid air cells are clear. The skullbase is within normal limits. Midline sagittal images are unremarkable. IMPRESSION: 1. No acute abnormality. 2. Mild moderate diffuse periventricular and subcortical white matter disease and moderate generalized atrophy. This is somewhat advanced for age and likely reflects the sequela of chronic microvascular ischemia. Electronically Signed   By: San Morelle M.D.   On: 08/25/2015 11:51   US Carotid Bilateral  08/25/2015  CLINICAL DATA:  82 year old female with symptoms of stroke EXAM: BILATERAL CAROTID DUPLEX ULTRASOUND TECHNIQUE: Pearline Cables scale imaging, color Doppler and duplex ultrasound were performed of bilateral carotid and vertebral arteries in the neck. COMPARISON:  Head CT 08/23/2012 FINDINGS: Criteria: Quantification of carotid stenosis is based on velocity parameters that correlate the residual internal carotid diameter with NASCET-based stenosis levels, using the diameter of the distal internal carotid lumen as the denominator for stenosis measurement. The following velocity measurements were obtained: RIGHT ICA:  59/11 cm/sec CCA:  [id number] cm/sec SYSTOLIC ICA/CCA RATIO:  0.8 DIASTOLIC ICA/CCA RATIO:  1.6 ECA:  48 cm/sec LEFT ICA:  60/11 cm/sec CCA:  [id number] cm/sec SYSTOLIC ICA/CCA RATIO:  0.8 DIASTOLIC ICA/CCA RATIO:  1.7 ECA:  32 cm/sec RIGHT CAROTID ARTERY: Intimal medial thickening. Trace atherosclerotic plaque in the proximal internal carotid artery without evidence of luminal narrowing. RIGHT VERTEBRAL ARTERY:  Patent with normal antegrade flow. LEFT CAROTID ARTERY: Trace focal heterogeneous  atherosclerotic plaque in the proximal internal carotid artery. By peak systemic velocity criteria the estimated stenosis remains less than 50%. LEFT VERTEBRAL ARTERY:  Patent with normal antegrade flow. IMPRESSION: 1. Mild (1-49%) stenosis proximal left internal carotid artery secondary to mild focal heterogeneous atherosclerotic plaque. 2. Trace atherosclerotic plaque on the right without evidence of stenosis. 3. Vertebral arteries are patent with normal antegrade flow. Signed, Criselda Peaches, MD [facility] Electronically Signed   By: Jacqulynn Cadet M.D.   On: 08/25/2015 10:18  EKG:   Orders placed or performed during the hospital encounter of 08/24/15  . ED EKG  . ED EKG      Management plans discussed with the patient, family and they are in agreement.  CODE STATUS:     Code Status Orders        Start     Ordered   08/24/15 1642  Limited resuscitation (code)   Continuous    Question Answer Comment  In the event of cardiac or respiratory ARREST: Initiate Code Blue, Call Rapid Response Yes   In the event of cardiac or respiratory ARREST: Perform CPR Yes   In the event of cardiac or respiratory ARREST: Perform Intubation/Mechanical Ventilation No   In the event of cardiac or respiratory ARREST: Use NIPPV/BiPAp only if indicated Yes   In the event of cardiac or respiratory ARREST: Administer ACLS medications if indicated Yes   In the event of cardiac or respiratory ARREST: Perform Defibrillation or Cardioversion if indicated Yes      08/24/15 1641    Code Status History    Date Active Date Inactive Code Status Order ID Comments User Context   This patient has a current code status but no historical code status.      TOTAL TIME TAKING CARE OF THIS PATIENT: 45 minutes.    @[facility] @  on 08/26/2015 at 1:47 PM  Between 7am to 6pm - Pager - [phone]  After 6pm go to [email or web address] - password [email or web address] [facility]  Office  [phone]  CC: Primary care physician; Pcp Not In System

## 2015-08-27 ENCOUNTER — Encounter
Admission: RE | Admit: 2015-08-27 | Discharge: 2015-08-27 | Disposition: A | Discharge status: Home / Self Care | Payer: PPO | Source: Ambulatory Visit | Attending: Internal Medicine | Admitting: Internal Medicine

## 2015-08-27 DIAGNOSIS — D649 Anemia, unspecified: Secondary | ICD-10-CM | POA: Insufficient documentation

## 2015-08-27 DIAGNOSIS — E860 Dehydration: Secondary | ICD-10-CM | POA: Insufficient documentation

## 2015-08-27 DIAGNOSIS — R109 Unspecified abdominal pain: Secondary | ICD-10-CM | POA: Insufficient documentation

## 2015-08-27 LAB — TYPE AND SCREEN
ABO/RH(D): B POS
Antibody Screen: NEGATIVE
UNIT DIVISION: 0
UNIT DIVISION: 0

## 2015-08-27 LAB — BASIC METABOLIC PANEL
ANION GAP: 8 (ref 5–15)
BUN: 21 mg/dL — ABNORMAL HIGH (ref 6–20)
CHLORIDE: 108 mmol/L (ref 101–111)
CO2: 18 mmol/L — AB (ref 22–32)
Calcium: 8.7 mg/dL — ABNORMAL LOW (ref 8.9–10.3)
Creatinine, Ser: 1.16 mg/dL — ABNORMAL HIGH (ref 0.44–1.00)
GFR calc Af Amer: 48 mL/min — ABNORMAL LOW (ref >60)
GFR calc non Af Amer: 41 mL/min — ABNORMAL LOW (ref >60)
GLUCOSE: 140 mg/dL — AB (ref 65–99)
POTASSIUM: 4.4 mmol/L (ref 3.5–5.1)
Sodium: 134 mmol/L — ABNORMAL LOW (ref 135–145)

## 2015-08-27 LAB — MAGNESIUM: Magnesium: 1.6 mg/dL — ABNORMAL LOW (ref 1.7–2.4)

## 2015-08-27 MED ORDER — MAGNESIUM SULFATE 2 GM/50ML IV SOLN
2.0000 g | Freq: Once | INTRAVENOUS | Status: AC
  Administered 2015-08-27: 12:00:00 2 g via INTRAVENOUS
  Filled 2015-08-27: qty 50

## 2015-08-27 MED ORDER — ENOXAPARIN SODIUM 40 MG/0.4ML ~~LOC~~ SOLN: 40.0000 mg | SUBCUTANEOUS | Status: DC

## 2015-08-27 NOTE — Clinical Social Work Placement (Signed)
   CLINICAL SOCIAL WORK PLACEMENT  NOTE  Date:  08/27/2015  Patient Details  Name: Janet Pope MRN: [id number] Date of Birth: 1926/08/24  Clinical Social Work is seeking post-discharge placement for this patient at the   level of care (*CSW will initial, date and re-position this form in  chart as items are completed):  Yes   Patient/family provided with [facility] Work Department's list of facilities offering this level of care within the geographic area requested by the patient (or if unable, by the patient's family).  Yes   Patient/family informed of their freedom to choose among providers that offer the needed level of care, that participate in Medicare, Medicaid or managed care program needed by the patient, have an available bed and are willing to accept the patient.  Yes   Patient/family informed of [facility]'s ownership interest in [facility] and [facility], as well as of the fact that they are under no obligation to receive care at these facilities.  PASRR submitted to EDS on       PASRR number received on       Existing PASRR number confirmed on 08/26/15     FL2 transmitted to all facilities in geographic area requested by pt/family on 08/26/15     FL2 transmitted to all facilities within larger geographic area on       Patient informed that his/her managed care company has contracts with or will negotiate with certain facilities, including the following:        Yes   Patient/family informed of bed offers received.  Patient chooses bed at [facility]     Physician recommends and patient chooses bed at  [facility])    Patient to be transferred to [facility] on 08/27/15.  Patient to be transferred to facility by [facility]     Patient family notified on 08/27/15 of transfer.  Name of family member notified:  Michelle Piper, brother     PHYSICIAN       Additional Comment:     _______________________________________________ Darden Dates, LCSW 08/27/2015, 10:18 PM

## 2015-08-27 NOTE — Progress Notes (Signed)
Patient has tolerated ASa since admission despite allergy, needs it if she is tolerating it for CVA/TIA

## 2015-08-27 NOTE — Clinical Social Work Note (Signed)
Clinical Social Work Assessment  Patient Details  Name: Janet Pope MRN: [id number] Date of Birth: 09/25/1926  Date of referral:  08/26/15               Reason for consult:  Facility Placement                Permission sought to share information with:  Family Supports Permission granted to share information::  Yes, Verbal Permission Granted  Name::     Janet Pope, brother   Housing/Transportation Living arrangements for the past 2 months:  Single Family Home Source of Information:  Patient Patient Interpreter Needed:   No Criminal Activity/Legal Involvement Pertinent to Current Situation/Hospitalization:   No Significant Relationships:  Siblings Lives with:  Self Do you feel safe going back to the place where you live?  No (Pt is unable to safely return home as she needs a higher level of care. ) Need for family participation in patient care:  Yes (Comment)  Care giving concerns:  Pt lives alone.  Social Worker assessment / plan:  CSW met with pt to address consult for New SNF as recommended by PT. CSW introduced herself and explained role of social work. CSW also explained the process of discharging to SNF. Pt is only oriented to self. Pt's siblings were present, therefore CSW completed assessment with brother, Janet, as he is local. Pt is a widow and never had children. Pt's brother and sister, who lives in [place] are agreeable to discharge to SNF for STR.   CSW initiated a SNF search and will follow up with bed offers. CSW will continue to follow.   Employment status:  Retired Insurance information:  Managed Medicare PT Recommendations:  Skilled Nursing Facility Information / Referral to community resources:  Skilled Nursing Facility  Patient/Family's Response to care:  Pt's family was appreciative of CSW support.   Patient/Family's Understanding of and Emotional Response to Diagnosis, Current Treatment, and Prognosis:  Pt's brother is willing to assist pt in anything to  help her get well.   Emotional Assessment Appearance:  Appears stated age Attitude/Demeanor/Rapport:  Other (Appropriate) Affect (typically observed):  Pleasant Orientation:  Oriented to Self Alcohol / Substance use:  Never Used Psych involvement (Current and /or in the community):  No (Comment)  Discharge Needs  Concerns to be addressed:  Adjustment to Illness Readmission within the last 30 days:    Current discharge risk:  Chronically ill Barriers to Discharge:  Continued Medical Work up    , LCSW 08/27/2015, 10:12 PM  

## 2015-08-27 NOTE — Progress Notes (Signed)
Patient discharged to [facility] per MD order. Report called to Nichele at facility. EMS called for transport.

## 2015-08-27 NOTE — Progress Notes (Signed)
Nsg reports poor Po's as Pt has been sleepy and only takes 2-3 bites. No reported s/sof aspiration. Will downgrade diet to Dys 2 for now. Discharge pending. ST to f/u 1-3 days if not discharged.

## 2015-08-27 NOTE — Progress Notes (Signed)
Noted that evening vitals did not transfer over.  Retook vitals for recording.  Pt hypotensive.  Called Dr Posey Pronto and instructed to continue to watch pt. Dorna Bloom RN

## 2015-08-27 NOTE — Clinical Social Work Note (Signed)
Pt ready for discharge today to [facility]. [employer] Josem Kaufmann is [id number]. RN called report [phone], room#207B). Transportation provided by [employer]. Pt's brother is aware and agreeable to discharge plan. He will be signing pt in. Facility has received discharge information. CSW is signing off as no further needs identified.   Janet Pope, MSW, LCSW Clinical Social Worker  [phone]

## 2015-08-27 NOTE — Discharge Summary (Signed)
[facility] [place] NAME: Janet Pope    MR#:  [id number]  DATE OF BIRTH:  1926-08-11  DATE OF ADMISSION:  08/24/2015 ADMITTING PHYSICIAN: Bettey Costa, MD  DATE OF DISCHARGE: 08/27/2015  PRIMARY CARE PHYSICIAN: Pcp Not In System    ADMISSION DIAGNOSIS:  Stroke (cerebrum) ([place]) [I63.9] Transient cerebral ischemia, unspecified transient cerebral ischemia type [G45.9]  DISCHARGE DIAGNOSIS:  Dehydration Acute kidney injury Generalized weakness  SECONDARY DIAGNOSIS:   Past Medical History  Diagnosis Date  . HLD (hyperlipidemia)   . GERD (gastroesophageal reflux disease)   . Kidney stone   . H/O lumpectomy   . Obstructive sleep apnea   . Gout   . Diverticulosis   . Nephrolithiasis     history of right lithotripsy  . History of valvular heart disease   . HTN (hypertension)   . Breast cancer ([place])   . CLL (chronic lymphocytic leukemia) ([place])   . Stomach cancer ([place])   . Gout   . CHF (congestive heart failure) ([place])   . Arthritis     HOSPITAL COURSE:   82 year old female with a history of CLL and hyperlipidemia who presents with generalized weakness and slurred speech since yesterday.  1. Altered mental status could be from acute renal failure from dehydration Clinically improving:  CT head and MRI of the brain are negative  carotid Doppler -mild stenosis Echocardiogram-normal EF, no embolic source identified Continue neuro checks.  PT consult-recommending snf  case management consulted for placement to rehabilitation. No further stroke workup needed per neurology   2. Acute on chronic anemia: Order anemia panel. Transfused 1 unit PRBCs Hemoglobin -8.2   3. Acute renal failure: This is likely due to poor by mouth intake. Improved with IV fluids 2.04--1.89--1.40.-1.16 Monitor input and output. Hold nephrotoxic agents including ibuprofen.  4. History of hypertension with hypotension: Discontinue  Norvasc and Coreg. Transfuse 1 unit of PRBC and provide IV fluid. In 2 to monitor blood pressure.  5. Recent cough and fever:  Levaquin discontinued on March 16.  6. Hyperlipidemia: LDL 54 continue simvastatin.  PT consult-rehabilitation  DISCHARGE CONDITIONS:   fair  CONSULTS OBTAINED:  Treatment Team:  Alexis Goodell, MD   PROCEDURES none  DRUG ALLERGIES:   Allergies  Allergen Reactions  . Aspirin Shortness Of Breath  . Ace Inhibitors Other (See Comments)    Reaction:  Unknown   . Eggs Or Egg-Derived Products Other (See Comments)    Reaction:  Unknown   . Milk-Related Compounds Other (See Comments)    Reaction:  Unknown   . Whey Other (See Comments)    Reaction:  Unknown     DISCHARGE MEDICATIONS:   Current Discharge Medication List    START taking these medications   Details  acetaminophen (TYLENOL) 325 MG tablet Take 2 tablets (650 mg total) by mouth every 6 (six) hours as needed for mild pain (or Fever >/= 101).    aspirin EC 81 MG tablet Take 1 tablet (81 mg total) by mouth daily.    senna-docusate (SENOKOT-S) 8.6-50 MG tablet Take 1 tablet by mouth at bedtime as needed for mild constipation.      CONTINUE these medications which have CHANGED   Details  HYDROcodone-acetaminophen (NORCO) 5-325 MG tablet Take 1-2 tablets by mouth every 4 (four) hours as needed for moderate pain. Qty: 30 tablet, Refills: 0      CONTINUE these medications which have NOT CHANGED   Details  albuterol (PROVENTIL HFA;VENTOLIN  HFA) 108 (90 Base) MCG/ACT inhaler Inhale 1-2 puffs into the lungs every 6 (six) hours as needed for wheezing or shortness of breath.    baclofen (LIORESAL) 10 MG tablet Take 1 tablet (10 mg total) by mouth 3 (three) times daily. Qty: 30 tablet, Refills: 0    carvedilol (COREG) 25 MG tablet Take 25 mg by mouth 2 (two) times daily with a meal.     chlorpheniramine-HYDROcodone (TUSSIONEX PENNKINETIC ER) 10-8 MG/5ML SUER Take 5 mLs by mouth 2 (two)  times daily. Qty: 140 mL, Refills: 0    latanoprost (XALATAN) 0.005 % ophthalmic solution Place 1 drop into both eyes at bedtime. Reported on 08/25/2015    omeprazole (PRILOSEC) 20 MG capsule Take 20 mg by mouth daily.    oxybutynin (DITROPAN) 5 MG tablet Take 5 mg by mouth 3 (three) times daily.     simethicone (MYLICON) 0000000 MG chewable tablet Chew 125 mg by mouth every 6 (six) hours as needed for flatulence.    simvastatin (ZOCOR) 20 MG tablet Take 20 mg by mouth at bedtime.     timolol (BETIMOL) 0.5 % ophthalmic solution Place 1 drop into both eyes 2 (two) times daily.       STOP taking these medications     ibuprofen (ADVIL,MOTRIN) 600 MG tablet      levofloxacin (LEVAQUIN) 750 MG tablet      polyethylene glycol powder (GLYCOLAX/MIRALAX) powder      Vitamin D, Ergocalciferol, (DRISDOL) 50000 units CAPS capsule          DISCHARGE INSTRUCTIONS:   Activity as recommended by physical therapy Diet-mechanical soft with thin liquids Follow-up with primary care physician at the facility in 3 days Follow-up with nephrology in 2 weeks as needed   DIET:  Cardiac diet-mechanical soft with thin liquids  DISCHARGE CONDITION:  Fair  ACTIVITY:  Activity as tolerated  OXYGEN:  Home Oxygen: No.   Oxygen Delivery: room air  DISCHARGE LOCATION:  nursing home -rehabilitation  If you experience worsening of your admission symptoms, develop shortness of breath, life threatening emergency, suicidal or homicidal thoughts you must seek medical attention immediately by calling 911 or calling your MD immediately  if symptoms less severe.  You Must read complete instructions/literature along with all the possible adverse reactions/side effects for all the Medicines you take and that have been prescribed to you. Take any new Medicines after you have completely understood and accpet all the possible adverse reactions/side effects.   Please note  You were cared for by a hospitalist  during your hospital stay. If you have any questions about your discharge medications or the care you received while you were in the hospital after you are discharged, you can call the unit and asked to speak with the hospitalist on call if the hospitalist that took care of you is not available. Once you are discharged, your primary care physician will handle any further medical issues. Please note that NO REFILLS for any discharge medications will be authorized once you are discharged, as it is imperative that you return to your primary care physician (or establish a relationship with a primary care physician if you do not have one) for your aftercare needs so that they can reassess your need for medications and monitor your lab values.     Today  Chief Complaint  Patient presents with  . Aphasia   Patient is feeling fine. Denies any complaints. Weakness is better.  ROS:  CONSTITUTIONAL: Denies fevers, chills. Denies any fatigue,  weakness.  EYES: Denies blurry vision, double vision, eye pain. EARS, NOSE, THROAT: Denies tinnitus, ear pain, hearing loss. RESPIRATORY: Denies cough, wheeze, shortness of breath.  CARDIOVASCULAR: Denies chest pain, palpitations, edema.  GASTROINTESTINAL: Denies nausea, vomiting, diarrhea, abdominal pain. Denies bright red blood per rectum. GENITOURINARY: Denies dysuria, hematuria. ENDOCRINE: Denies nocturia or thyroid problems. HEMATOLOGIC AND LYMPHATIC: Denies easy bruising or bleeding. SKIN: Denies rash or lesion. MUSCULOSKELETAL: Denies pain in neck, back, shoulder, knees, hips or arthritic symptoms.  NEUROLOGIC: Denies paralysis, paresthesias.  PSYCHIATRIC: Denies anxiety or depressive symptoms.   VITAL SIGNS:  Blood pressure 117/53, pulse 65, temperature 98.4 F (36.9 C), temperature source Oral, resp. rate 24, height 5\' 3"  (1.6 m), weight 70.353 kg (155 lb 1.6 oz), SpO2 97 %.  I/O:    Intake/Output Summary (Last 24 hours) at 08/27/15 1457 Last  data filed at 08/27/15 1300  Gross per 24 hour  Intake    455 ml  Output      0 ml  Net    455 ml    PHYSICAL EXAMINATION:  GENERAL:  82 y.o.-year-old patient lying in the bed with no acute distress.  EYES: Pupils equal, round, reactive to light and accommodation. No scleral icterus. Extraocular muscles intact.  HEENT: Head atraumatic, normocephalic. Oropharynx and nasopharynx clear.  NECK:  Supple, no jugular venous distention. No thyroid enlargement, no tenderness.  LUNGS: Normal breath sounds bilaterally, no wheezing, rales,rhonchi or crepitation. No use of accessory muscles of respiration.  CARDIOVASCULAR: S1, S2 normal. No murmurs, rubs, or gallops.  ABDOMEN: Soft, non-tender, non-distended. Bowel sounds present. No organomegaly or mass.  EXTREMITIES: No pedal edema, cyanosis, or clubbing.  NEUROLOGIC: Cranial nerves II through XII are intact. Muscle strength 5/5 in all extremities. Sensation intact. Gait not checked.  PSYCHIATRIC: The patient is alert and oriented x 3.  SKIN: No obvious rash, lesion, or ulcer.   DATA REVIEW:   CBC  Recent Labs Lab 08/26/15 0411  WBC 9.5  HGB 8.9*  HCT 26.5*  PLT 298    Chemistries   Recent Labs Lab 08/24/15 1445  08/27/15 0631  NA 135  < > 134*  K 3.4*  < > 4.4  CL 102  < > 108  CO2 25  < > 18*  GLUCOSE 114*  < > 140*  BUN 35*  < > 21*  CREATININE 2.04*  < > 1.16*  CALCIUM 10.1  < > 8.7*  MG  --   --  1.6*  AST 10*  --   --   ALT 11*  --   --   ALKPHOS 94  --   --   BILITOT 0.9  --   --   < > = values in this interval not displayed.  Cardiac Enzymes  Recent Labs Lab 08/24/15 1445  TROPONINI <0.03    Microbiology Results  Results for orders placed or performed during the hospital encounter of 08/24/15  Urine culture     Status: None (Preliminary result)   Collection Time: 08/25/15  4:36 PM  Result Value Ref Range Status   Specimen Description URINE, RANDOM  Final   Special Requests NONE  Final   Culture NO  GROWTH < 12 HOURS  Final   Report Status PENDING  Incomplete    RADIOLOGY:  Dg Abd 1 View  08/25/2015  CLINICAL DATA:  MR screening EXAM: ABDOMEN - 1 VIEW COMPARISON:  11/10/2013 FINDINGS: BILATERAL hip prostheses. Bones demineralized. RIGHT pelvic phlebolith. Clustered nonobstructing RIGHT renal calculi 14 x  9 mm. Clothing artifacts from bra clips. No other radiopaque foreign bodies identified. IMPRESSION: Nonobstructing RIGHT renal calculi. BILATERAL hip prostheses. Electronically Signed   By: Lavonia Dana M.D.   On: 08/25/2015 11:15   Ct Head Wo Contrast  08/24/2015  CLINICAL DATA:  Slurred speech.  Weakness and confusion for 3 days. EXAM: CT HEAD WITHOUT CONTRAST TECHNIQUE: Contiguous axial images were obtained from the base of the skull through the vertex without intravenous contrast. COMPARISON:  09/27/2011 PET FINDINGS: Sinuses/Soft tissues: No significant soft tissue swelling. Clear paranasal sinuses and mastoid air cells. Hyperostosis frontalis interna. Intracranial: Mild to moderate low density in the periventricular white matter likely related to small vessel disease. Cerebral and cerebellar volume loss is than normal limits for age. No mass lesion, hemorrhage, hydrocephalus, acute infarct, intra-axial, or extra-axial fluid collection. IMPRESSION: 1.  No acute intracranial abnormality. 2.  Cerebral atrophy and small vessel ischemic change. Electronically Signed   By: Abigail Miyamoto M.D.   On: 08/24/2015 14:52   Mr Brain Wo Contrast  08/25/2015  CLINICAL DATA:  New onset of slurred speech and altered mental status since yesterday. Memory loss above baseline. EXAM: MRI HEAD WITHOUT CONTRAST TECHNIQUE: Multiplanar, multiecho pulse sequences of the brain and surrounding structures were obtained without intravenous contrast. COMPARISON:  CT head without contrast 08/24/2015 FINDINGS: Diffusion-weighted images demonstrate no evidence for acute or subacute infarction. Mild to moderate periventricular and  subcortical white matter changes are present bilaterally. There is moderate generalized atrophy. Dilated perivascular spaces are noted in the basal ganglia bilaterally, more prominent on the left. Minimal white matter changes extend into the brainstem. The cerebellum is unremarkable. The internal auditory canals are normal. Flow is present in the major intracranial arteries. Bilateral lens replacements are present. The globes and orbits are otherwise intact. The paranasal sinuses and mastoid air cells are clear. The skullbase is within normal limits. Midline sagittal images are unremarkable. IMPRESSION: 1. No acute abnormality. 2. Mild moderate diffuse periventricular and subcortical white matter disease and moderate generalized atrophy. This is somewhat advanced for age and likely reflects the sequela of chronic microvascular ischemia. Electronically Signed   By: San Morelle M.D.   On: 08/25/2015 11:51   US Carotid Bilateral  08/25/2015  CLINICAL DATA:  82 year old female with symptoms of stroke EXAM: BILATERAL CAROTID DUPLEX ULTRASOUND TECHNIQUE: Pearline Cables scale imaging, color Doppler and duplex ultrasound were performed of bilateral carotid and vertebral arteries in the neck. COMPARISON:  Head CT 08/23/2012 FINDINGS: Criteria: Quantification of carotid stenosis is based on velocity parameters that correlate the residual internal carotid diameter with NASCET-based stenosis levels, using the diameter of the distal internal carotid lumen as the denominator for stenosis measurement. The following velocity measurements were obtained: RIGHT ICA:  59/11 cm/sec CCA:  [id number] cm/sec SYSTOLIC ICA/CCA RATIO:  0.8 DIASTOLIC ICA/CCA RATIO:  1.6 ECA:  48 cm/sec LEFT ICA:  60/11 cm/sec CCA:  [id number] cm/sec SYSTOLIC ICA/CCA RATIO:  0.8 DIASTOLIC ICA/CCA RATIO:  1.7 ECA:  32 cm/sec RIGHT CAROTID ARTERY: Intimal medial thickening. Trace atherosclerotic plaque in the proximal internal carotid artery without evidence of luminal  narrowing. RIGHT VERTEBRAL ARTERY:  Patent with normal antegrade flow. LEFT CAROTID ARTERY: Trace focal heterogeneous atherosclerotic plaque in the proximal internal carotid artery. By peak systemic velocity criteria the estimated stenosis remains less than 50%. LEFT VERTEBRAL ARTERY:  Patent with normal antegrade flow. IMPRESSION: 1. Mild (1-49%) stenosis proximal left internal carotid artery secondary to mild focal heterogeneous atherosclerotic plaque. 2. Trace atherosclerotic plaque on the  right without evidence of stenosis. 3. Vertebral arteries are patent with normal antegrade flow. Signed, Criselda Peaches, MD [facility] Electronically Signed   By: Jacqulynn Cadet M.D.   On: 08/25/2015 10:18    EKG:   Orders placed or performed during the hospital encounter of 08/24/15  . ED EKG  . ED EKG      Management plans discussed with the patient, family and they are in agreement.  CODE STATUS:     Code Status Orders        Start     Ordered   08/24/15 1642  Limited resuscitation (code)   Continuous    Question Answer Comment  In the event of cardiac or respiratory ARREST: Initiate Code Blue, Call Rapid Response Yes   In the event of cardiac or respiratory ARREST: Perform CPR Yes   In the event of cardiac or respiratory ARREST: Perform Intubation/Mechanical Ventilation No   In the event of cardiac or respiratory ARREST: Use NIPPV/BiPAp only if indicated Yes   In the event of cardiac or respiratory ARREST: Administer ACLS medications if indicated Yes   In the event of cardiac or respiratory ARREST: Perform Defibrillation or Cardioversion if indicated Yes      08/24/15 1641    Code Status History    Date Active Date Inactive Code Status Order ID Comments User Context   This patient has a current code status but no historical code status.      TOTAL TIME TAKING CARE OF THIS PATIENT: 45 minutes.    @[facility] @  on 08/27/2015  at 2:57 PM  Between 7am to 6pm - Pager - [phone]  After 6pm go to [email or web address] - password [email or web address] [facility]  Office  [phone]  CC: Primary care physician; Pcp Not In System

## 2015-08-27 NOTE — Clinical Social Work Placement (Deleted)
   CLINICAL SOCIAL WORK PLACEMENT  NOTE  Date:  08/27/2015  Patient Details  Name: Janet Pope MRN: [id number] Date of Birth: 22-Oct-1926  Clinical Social Work is seeking post-discharge placement for this patient at the   level of care (*CSW will initial, date and re-position this form in  chart as items are completed):  Yes   Patient/family provided with [facility] Work Department's list of facilities offering this level of care within the geographic area requested by the patient (or if unable, by the patient's family).  Yes   Patient/family informed of their freedom to choose among providers that offer the needed level of care, that participate in Medicare, Medicaid or managed care program needed by the patient, have an available bed and are willing to accept the patient.  Yes   Patient/family informed of [facility]'s ownership interest in [facility] and [facility], as well as of the fact that they are under no obligation to receive care at these facilities.  PASRR submitted to EDS on       PASRR number received on       Existing PASRR number confirmed on 08/26/15     FL2 transmitted to all facilities in geographic area requested by pt/family on 08/26/15     FL2 transmitted to all facilities within larger geographic area on       Patient informed that his/her managed care company has contracts with or will negotiate with certain facilities, including the following:        Yes   Patient/family informed of bed offers received.  Patient [facility]     Physician recommends and patient chooses bed at  [facility])    Patient to be transferred to [facility] on 08/27/15.  Patient to be transferred to facility by [facility]     Patient family notified on 08/27/15 of transfer.  Name of family member notified:  Janet Pope, brother     PHYSICIAN       Additional Comment:     _______________________________________________ Darden Dates, LCSW 08/27/2015, 10:01 PM

## 2015-08-27 NOTE — Progress Notes (Signed)
Reported to Dr Margaretmary Eddy that patient is lethargic, not really responding. Acknowledged. No new orders.

## 2015-08-28 LAB — URINE CULTURE: Culture: 80000

## 2015-08-30 DIAGNOSIS — D649 Anemia, unspecified: Secondary | ICD-10-CM | POA: Diagnosis not present

## 2015-08-30 DIAGNOSIS — E860 Dehydration: Secondary | ICD-10-CM | POA: Diagnosis present

## 2015-08-30 DIAGNOSIS — R109 Unspecified abdominal pain: Secondary | ICD-10-CM | POA: Diagnosis present

## 2015-08-30 LAB — COMPREHENSIVE METABOLIC PANEL
ALBUMIN: 2 g/dL — AB (ref 3.5–5.0)
ALK PHOS: 83 U/L (ref 38–126)
ALT: 13 U/L — AB (ref 14–54)
AST: 21 U/L (ref 15–41)
Anion gap: 8 (ref 5–15)
BILIRUBIN TOTAL: 3.1 mg/dL — AB (ref 0.3–1.2)
BUN: 23 mg/dL — AB (ref 6–20)
CO2: 23 mmol/L (ref 22–32)
CREATININE: 0.95 mg/dL (ref 0.44–1.00)
Calcium: 10.4 mg/dL — ABNORMAL HIGH (ref 8.9–10.3)
Chloride: 110 mmol/L (ref 101–111)
GFR calc Af Amer: >60 mL/min (ref >60)
GFR calc non Af Amer: 53 mL/min — ABNORMAL LOW (ref >60)
GLUCOSE: 162 mg/dL — AB (ref 65–99)
POTASSIUM: 3.7 mmol/L (ref 3.5–5.1)
Sodium: 141 mmol/L (ref 135–145)
TOTAL PROTEIN: 6.8 g/dL (ref 6.5–8.1)

## 2015-08-30 LAB — CBC WITH DIFFERENTIAL/PLATELET
Basophils Absolute: 0 10*3/uL (ref 0–0.1)
Basophils Relative: 0 %
EOS ABS: 0.1 10*3/uL (ref 0–0.7)
EOS PCT: 1 %
HCT: 27.1 % — ABNORMAL LOW (ref 35.0–47.0)
Hemoglobin: 8.9 g/dL — ABNORMAL LOW (ref 12.0–16.0)
LYMPHS ABS: 0.3 10*3/uL — AB (ref 1.0–3.6)
Lymphocytes Relative: 2 %
MCH: 25.5 pg — AB (ref 26.0–34.0)
MCHC: 32.7 g/dL (ref 32.0–36.0)
MCV: 77.9 fL — ABNORMAL LOW (ref 80.0–100.0)
MONO ABS: 0.6 10*3/uL (ref 0.2–0.9)
MONOS PCT: 4 %
Neutro Abs: 14.7 10*3/uL — ABNORMAL HIGH (ref 1.4–6.5)
Neutrophils Relative %: 93 %
Platelets: 257 10*3/uL (ref 150–440)
RBC: 3.48 MIL/uL — ABNORMAL LOW (ref 3.80–5.20)
RDW: 19.8 % — AB (ref 11.5–14.5)
WBC: 15.6 10*3/uL — ABNORMAL HIGH (ref 3.6–11.0)

## 2015-08-31 DIAGNOSIS — D649 Anemia, unspecified: Secondary | ICD-10-CM | POA: Diagnosis not present

## 2015-08-31 LAB — COMPREHENSIVE METABOLIC PANEL
ALBUMIN: 1.9 g/dL — AB (ref 3.5–5.0)
ALT: 17 U/L (ref 14–54)
ANION GAP: 9 (ref 5–15)
AST: 18 U/L (ref 15–41)
Alkaline Phosphatase: 77 U/L (ref 38–126)
BUN: 27 mg/dL — AB (ref 6–20)
CHLORIDE: 110 mmol/L (ref 101–111)
CO2: 22 mmol/L (ref 22–32)
Calcium: 10.2 mg/dL (ref 8.9–10.3)
Creatinine, Ser: 0.88 mg/dL (ref 0.44–1.00)
GFR calc Af Amer: >60 mL/min (ref >60)
GFR calc non Af Amer: 58 mL/min — ABNORMAL LOW (ref >60)
GLUCOSE: 104 mg/dL — AB (ref 65–99)
POTASSIUM: 3.7 mmol/L (ref 3.5–5.1)
SODIUM: 141 mmol/L (ref 135–145)
TOTAL PROTEIN: 6.2 g/dL — AB (ref 6.5–8.1)
Total Bilirubin: 3.3 mg/dL — ABNORMAL HIGH (ref 0.3–1.2)

## 2015-08-31 LAB — CBC WITH DIFFERENTIAL/PLATELET
BASOS ABS: 0 10*3/uL (ref 0–0.1)
BASOS PCT: 0 %
EOS ABS: 0.1 10*3/uL (ref 0–0.7)
Eosinophils Relative: 0 %
HCT: 23.8 % — ABNORMAL LOW (ref 35.0–47.0)
HEMOGLOBIN: 7.8 g/dL — AB (ref 12.0–16.0)
Lymphocytes Relative: 1 %
Lymphs Abs: 0.2 10*3/uL — ABNORMAL LOW (ref 1.0–3.6)
MCH: 25.4 pg — ABNORMAL LOW (ref 26.0–34.0)
MCHC: 32.9 g/dL (ref 32.0–36.0)
MCV: 77.3 fL — ABNORMAL LOW (ref 80.0–100.0)
Monocytes Absolute: 0.7 10*3/uL (ref 0.2–0.9)
Monocytes Relative: 4 %
NEUTROS PCT: 95 %
Neutro Abs: 17.2 10*3/uL — ABNORMAL HIGH (ref 1.4–6.5)
PLATELETS: 244 10*3/uL (ref 150–440)
RBC: 3.08 MIL/uL — AB (ref 3.80–5.20)
RDW: 20.4 % — AB (ref 11.5–14.5)
WBC: 18.3 10*3/uL — AB (ref 3.6–11.0)

## 2015-09-01 DIAGNOSIS — D649 Anemia, unspecified: Secondary | ICD-10-CM | POA: Diagnosis not present

## 2015-09-01 LAB — COMPREHENSIVE METABOLIC PANEL
ALBUMIN: 1.8 g/dL — AB (ref 3.5–5.0)
ALT: 22 U/L (ref 14–54)
ANION GAP: 7 (ref 5–15)
AST: 24 U/L (ref 15–41)
Alkaline Phosphatase: 77 U/L (ref 38–126)
BUN: 31 mg/dL — ABNORMAL HIGH (ref 6–20)
CALCIUM: 10.1 mg/dL (ref 8.9–10.3)
CO2: 24 mmol/L (ref 22–32)
Chloride: 110 mmol/L (ref 101–111)
Creatinine, Ser: 0.99 mg/dL (ref 0.44–1.00)
GFR calc non Af Amer: 50 mL/min — ABNORMAL LOW (ref >60)
GFR, EST AFRICAN AMERICAN: 58 mL/min — AB (ref >60)
GLUCOSE: 144 mg/dL — AB (ref 65–99)
POTASSIUM: 3.8 mmol/L (ref 3.5–5.1)
SODIUM: 141 mmol/L (ref 135–145)
Total Bilirubin: 4.4 mg/dL — ABNORMAL HIGH (ref 0.3–1.2)
Total Protein: 5.9 g/dL — ABNORMAL LOW (ref 6.5–8.1)

## 2015-09-01 LAB — CBC WITH DIFFERENTIAL/PLATELET
BASOS ABS: 0.2 10*3/uL — AB (ref 0–0.1)
Basophils Relative: 1 %
Eosinophils Absolute: 0.2 10*3/uL (ref 0–0.7)
Eosinophils Relative: 1 %
HCT: 23.5 % — ABNORMAL LOW (ref 35.0–47.0)
Hemoglobin: 7.5 g/dL — ABNORMAL LOW (ref 12.0–16.0)
LYMPHS ABS: 0.2 10*3/uL — AB (ref 1.0–3.6)
Lymphocytes Relative: 1 %
MCH: 25.1 pg — AB (ref 26.0–34.0)
MCHC: 32 g/dL (ref 32.0–36.0)
MCV: 78.3 fL — ABNORMAL LOW (ref 80.0–100.0)
MONO ABS: 0.7 10*3/uL (ref 0.2–0.9)
Monocytes Relative: 3 %
NEUTROS PCT: 94 %
Neutro Abs: 20.7 10*3/uL — ABNORMAL HIGH (ref 1.4–6.5)
PLATELETS: 261 10*3/uL (ref 150–440)
RBC: 3 MIL/uL — AB (ref 3.80–5.20)
RDW: 20.2 % — AB (ref 11.5–14.5)
WBC: 22 10*3/uL — AB (ref 3.6–11.0)

## 2015-09-02 ENCOUNTER — Emergency Department: Payer: PPO

## 2015-09-02 ENCOUNTER — Inpatient Hospital Stay
Admission: EM | Admit: 2015-09-02 | Discharge: 2015-09-11 | DRG: 871 | Disposition: E | Payer: PPO | Attending: Internal Medicine | Admitting: Internal Medicine

## 2015-09-02 ENCOUNTER — Encounter: Payer: Self-pay | Admitting: Emergency Medicine

## 2015-09-02 ENCOUNTER — Other Ambulatory Visit: Payer: Self-pay

## 2015-09-02 ENCOUNTER — Ambulatory Visit
Admission: RE | Admit: 2015-09-02 | Discharge: 2015-09-02 | Disposition: A | Discharge status: Home / Self Care | Payer: PPO | Source: Ambulatory Visit | Attending: Internal Medicine | Admitting: Internal Medicine

## 2015-09-02 DIAGNOSIS — Z91012 Allergy to eggs: Secondary | ICD-10-CM

## 2015-09-02 DIAGNOSIS — Y95 Nosocomial condition: Secondary | ICD-10-CM | POA: Diagnosis present

## 2015-09-02 DIAGNOSIS — Z853 Personal history of malignant neoplasm of breast: Secondary | ICD-10-CM

## 2015-09-02 DIAGNOSIS — A419 Sepsis, unspecified organism: Principal | ICD-10-CM | POA: Diagnosis present

## 2015-09-02 DIAGNOSIS — Z66 Do not resuscitate: Secondary | ICD-10-CM | POA: Diagnosis present

## 2015-09-02 DIAGNOSIS — I050 Rheumatic mitral stenosis: Secondary | ICD-10-CM | POA: Diagnosis present

## 2015-09-02 DIAGNOSIS — Z515 Encounter for palliative care: Secondary | ICD-10-CM | POA: Diagnosis present

## 2015-09-02 DIAGNOSIS — I959 Hypotension, unspecified: Secondary | ICD-10-CM | POA: Diagnosis present

## 2015-09-02 DIAGNOSIS — M109 Gout, unspecified: Secondary | ICD-10-CM | POA: Diagnosis present

## 2015-09-02 DIAGNOSIS — Z87442 Personal history of urinary calculi: Secondary | ICD-10-CM

## 2015-09-02 DIAGNOSIS — J189 Pneumonia, unspecified organism: Secondary | ICD-10-CM | POA: Diagnosis present

## 2015-09-02 DIAGNOSIS — Z96641 Presence of right artificial hip joint: Secondary | ICD-10-CM | POA: Diagnosis present

## 2015-09-02 DIAGNOSIS — K219 Gastro-esophageal reflux disease without esophagitis: Secondary | ICD-10-CM | POA: Diagnosis present

## 2015-09-02 DIAGNOSIS — Z91011 Allergy to milk products: Secondary | ICD-10-CM | POA: Diagnosis not present

## 2015-09-02 DIAGNOSIS — Z6826 Body mass index (BMI) 26.0-26.9, adult: Secondary | ICD-10-CM | POA: Diagnosis not present

## 2015-09-02 DIAGNOSIS — I5032 Chronic diastolic (congestive) heart failure: Secondary | ICD-10-CM | POA: Diagnosis present

## 2015-09-02 DIAGNOSIS — R627 Adult failure to thrive: Secondary | ICD-10-CM | POA: Diagnosis present

## 2015-09-02 DIAGNOSIS — G4733 Obstructive sleep apnea (adult) (pediatric): Secondary | ICD-10-CM | POA: Diagnosis present

## 2015-09-02 DIAGNOSIS — Z87440 Personal history of urinary (tract) infections: Secondary | ICD-10-CM

## 2015-09-02 DIAGNOSIS — D649 Anemia, unspecified: Secondary | ICD-10-CM | POA: Insufficient documentation

## 2015-09-02 DIAGNOSIS — Z886 Allergy status to analgesic agent status: Secondary | ICD-10-CM | POA: Diagnosis not present

## 2015-09-02 DIAGNOSIS — Z85028 Personal history of other malignant neoplasm of stomach: Secondary | ICD-10-CM

## 2015-09-02 DIAGNOSIS — J9811 Atelectasis: Secondary | ICD-10-CM | POA: Diagnosis present

## 2015-09-02 DIAGNOSIS — E43 Unspecified severe protein-calorie malnutrition: Secondary | ICD-10-CM | POA: Diagnosis present

## 2015-09-02 DIAGNOSIS — Z8249 Family history of ischemic heart disease and other diseases of the circulatory system: Secondary | ICD-10-CM | POA: Diagnosis not present

## 2015-09-02 DIAGNOSIS — Z8614 Personal history of Methicillin resistant Staphylococcus aureus infection: Secondary | ICD-10-CM

## 2015-09-02 DIAGNOSIS — E785 Hyperlipidemia, unspecified: Secondary | ICD-10-CM | POA: Diagnosis present

## 2015-09-02 DIAGNOSIS — I110 Hypertensive heart disease with heart failure: Secondary | ICD-10-CM | POA: Diagnosis present

## 2015-09-02 DIAGNOSIS — D638 Anemia in other chronic diseases classified elsewhere: Secondary | ICD-10-CM | POA: Diagnosis present

## 2015-09-02 DIAGNOSIS — C9110 Chronic lymphocytic leukemia of B-cell type not having achieved remission: Secondary | ICD-10-CM | POA: Diagnosis present

## 2015-09-02 DIAGNOSIS — R509 Fever, unspecified: Secondary | ICD-10-CM

## 2015-09-02 HISTORY — DX: Anemia, unspecified: D64.9

## 2015-09-02 LAB — CBC WITH DIFFERENTIAL/PLATELET
BASOS ABS: 0 10*3/uL (ref 0–0.1)
BASOS PCT: 0 %
BASOS PCT: 0 %
Basophils Absolute: 0 10*3/uL (ref 0–0.1)
EOS ABS: 0 10*3/uL (ref 0–0.7)
EOS PCT: 0 %
EOS PCT: 0 %
Eosinophils Absolute: 0 10*3/uL (ref 0–0.7)
HCT: 21.1 % — ABNORMAL LOW (ref 35.0–47.0)
HEMATOCRIT: 21.6 % — AB (ref 35.0–47.0)
Hemoglobin: 7.1 g/dL — ABNORMAL LOW (ref 12.0–16.0)
Hemoglobin: 6.8 g/dL — ABNORMAL LOW (ref 12.0–16.0)
Lymphocytes Relative: 1 %
Lymphocytes Relative: 0 %
Lymphs Abs: 0.1 10*3/uL — ABNORMAL LOW (ref 1.0–3.6)
Lymphs Abs: 0.1 10*3/uL — ABNORMAL LOW (ref 1.0–3.6)
MCH: 25.3 pg — ABNORMAL LOW (ref 26.0–34.0)
MCH: 24.8 pg — AB (ref 26.0–34.0)
MCHC: 32.6 g/dL (ref 32.0–36.0)
MCHC: 32.1 g/dL (ref 32.0–36.0)
MCV: 77.4 fL — ABNORMAL LOW (ref 80.0–100.0)
MCV: 77.2 fL — ABNORMAL LOW (ref 80.0–100.0)
MONO ABS: 0.5 10*3/uL (ref 0.2–0.9)
MONO ABS: 0.5 10*3/uL (ref 0.2–0.9)
MONOS PCT: 2 %
MONOS PCT: 2 %
NEUTROS ABS: 22.1 10*3/uL — AB (ref 1.4–6.5)
NEUTROS PCT: 98 %
Neutro Abs: 22.9 10*3/uL — ABNORMAL HIGH (ref 1.4–6.5)
Neutrophils Relative %: 97 %
PLATELETS: 209 10*3/uL (ref 150–440)
PLATELETS: 224 10*3/uL (ref 150–440)
RBC: 2.79 MIL/uL — ABNORMAL LOW (ref 3.80–5.20)
RBC: 2.74 MIL/uL — ABNORMAL LOW (ref 3.80–5.20)
RDW: 20.4 % — AB (ref 11.5–14.5)
RDW: 20.5 % — AB (ref 11.5–14.5)
WBC: 22.8 10*3/uL — AB (ref 3.6–11.0)
WBC: 23.5 10*3/uL — ABNORMAL HIGH (ref 3.6–11.0)

## 2015-09-02 LAB — URINALYSIS COMPLETE WITH MICROSCOPIC (ARMC ONLY)
Glucose, UA: NEGATIVE mg/dL
Ketones, ur: NEGATIVE mg/dL
LEUKOCYTES UA: NEGATIVE
NITRITE: NEGATIVE
PH: 5 (ref 5.0–8.0)
PROTEIN: 30 mg/dL — AB
Specific Gravity, Urine: 1.02 (ref 1.005–1.030)

## 2015-09-02 LAB — COMPREHENSIVE METABOLIC PANEL
ALBUMIN: 1.5 g/dL — AB (ref 3.5–5.0)
ALT: 21 U/L (ref 14–54)
ALT: 18 U/L (ref 14–54)
ANION GAP: 6 (ref 5–15)
AST: 16 U/L (ref 15–41)
AST: 18 U/L (ref 15–41)
Albumin: 1.6 g/dL — ABNORMAL LOW (ref 3.5–5.0)
Alkaline Phosphatase: 73 U/L (ref 38–126)
Alkaline Phosphatase: 70 U/L (ref 38–126)
Anion gap: 6 (ref 5–15)
BILIRUBIN TOTAL: 5 mg/dL — AB (ref 0.3–1.2)
BUN: 37 mg/dL — AB (ref 6–20)
BUN: 37 mg/dL — ABNORMAL HIGH (ref 6–20)
CO2: 24 mmol/L (ref 22–32)
CO2: 23 mmol/L (ref 22–32)
Calcium: 10 mg/dL (ref 8.9–10.3)
Calcium: 9.5 mg/dL (ref 8.9–10.3)
Chloride: 110 mmol/L (ref 101–111)
Chloride: 112 mmol/L — ABNORMAL HIGH (ref 101–111)
Creatinine, Ser: 0.97 mg/dL (ref 0.44–1.00)
Creatinine, Ser: 1.04 mg/dL — ABNORMAL HIGH (ref 0.44–1.00)
GFR calc non Af Amer: 51 mL/min — ABNORMAL LOW (ref >60)
GFR calc non Af Amer: 47 mL/min — ABNORMAL LOW (ref >60)
GFR, EST AFRICAN AMERICAN: 60 mL/min — AB (ref >60)
GFR, EST AFRICAN AMERICAN: 55 mL/min — AB (ref >60)
GLUCOSE: 125 mg/dL — AB (ref 65–99)
Glucose, Bld: 150 mg/dL — ABNORMAL HIGH (ref 65–99)
POTASSIUM: 4 mmol/L (ref 3.5–5.1)
POTASSIUM: 4.1 mmol/L (ref 3.5–5.1)
SODIUM: 141 mmol/L (ref 135–145)
Sodium: 140 mmol/L (ref 135–145)
TOTAL PROTEIN: 5.4 g/dL — AB (ref 6.5–8.1)
Total Bilirubin: 5.6 mg/dL — ABNORMAL HIGH (ref 0.3–1.2)
Total Protein: 5.1 g/dL — ABNORMAL LOW (ref 6.5–8.1)

## 2015-09-02 LAB — GLUCOSE, CAPILLARY
GLUCOSE-CAPILLARY: 148 mg/dL — AB (ref 65–99)
GLUCOSE-CAPILLARY: 163 mg/dL — AB (ref 65–99)

## 2015-09-02 LAB — TYPE AND SCREEN
ABO/RH(D): B POS
ANTIBODY SCREEN: NEGATIVE
UNIT DIVISION: 0
Unit division: 0

## 2015-09-02 LAB — PROTIME-INR
INR: 2.21
Prothrombin Time: 24.3 seconds — ABNORMAL HIGH (ref 11.4–15.0)

## 2015-09-02 LAB — TROPONIN I

## 2015-09-02 LAB — TSH: TSH: 1.171 u[IU]/mL (ref 0.350–4.500)

## 2015-09-02 LAB — APTT: APTT: 50 s — AB (ref 24–36)

## 2015-09-02 LAB — PROCALCITONIN: Procalcitonin: 3.31 ng/mL

## 2015-09-02 LAB — LACTIC ACID, PLASMA
LACTIC ACID, VENOUS: 1.4 mmol/L (ref 0.5–2.0)
LACTIC ACID, VENOUS: 1.3 mmol/L (ref 0.5–2.0)

## 2015-09-02 LAB — PREPARE RBC (CROSSMATCH)

## 2015-09-02 LAB — MRSA PCR SCREENING: MRSA by PCR: POSITIVE — AB

## 2015-09-02 LAB — BRAIN NATRIURETIC PEPTIDE: B NATRIURETIC PEPTIDE 5: 573 pg/mL — AB (ref 0.0–100.0)

## 2015-09-02 MED ORDER — ACETAMINOPHEN 650 MG RE SUPP: 650.0000 mg | Freq: Four times a day (QID) | RECTAL | Status: DC | PRN

## 2015-09-02 MED ORDER — DOCUSATE SODIUM 100 MG PO CAPS
100.0000 mg | ORAL_CAPSULE | Freq: Two times a day (BID) | ORAL | Status: DC
  Administered 2015-09-03: 100 mg via ORAL
  Filled 2015-09-02 (×2): qty 1

## 2015-09-02 MED ORDER — SODIUM CHLORIDE 0.9% FLUSH
3.0000 mL | Freq: Two times a day (BID) | INTRAVENOUS | Status: DC
  Administered 2015-09-02 – 2015-09-06 (×9): 3 mL via INTRAVENOUS

## 2015-09-02 MED ORDER — VANCOMYCIN HCL IN DEXTROSE 1-5 GM/200ML-% IV SOLN
1000.0000 mg | Freq: Once | INTRAVENOUS | Status: AC
  Administered 2015-09-02: 1000 mg via INTRAVENOUS
  Filled 2015-09-02: qty 200

## 2015-09-02 MED ORDER — LEVOFLOXACIN IN D5W 750 MG/150ML IV SOLN
750.0000 mg | Freq: Once | INTRAVENOUS | Status: AC
  Administered 2015-09-02: 750 mg via INTRAVENOUS
  Filled 2015-09-02: qty 150

## 2015-09-02 MED ORDER — ONDANSETRON HCL 4 MG PO TABS: 4.0000 mg | ORAL_TABLET | Freq: Four times a day (QID) | ORAL | Status: DC | PRN

## 2015-09-02 MED ORDER — ACETAMINOPHEN 325 MG PO TABS: 650.0000 mg | ORAL_TABLET | Freq: Four times a day (QID) | ORAL | Status: DC | PRN

## 2015-09-02 MED ORDER — VANCOMYCIN HCL IN DEXTROSE 750-5 MG/150ML-% IV SOLN
750.0000 mg | INTRAVENOUS | Status: DC
  Administered 2015-09-03: 750 mg via INTRAVENOUS
  Filled 2015-09-02 (×2): qty 150

## 2015-09-02 MED ORDER — DEXTROSE 5 % IV SOLN
2.0000 g | Freq: Once | INTRAVENOUS | Status: AC
  Administered 2015-09-02: 2 g via INTRAVENOUS
  Filled 2015-09-02: qty 2

## 2015-09-02 MED ORDER — SODIUM CHLORIDE 0.9 % IV BOLUS (SEPSIS): 500.0000 mL | INTRAVENOUS | Status: AC

## 2015-09-02 MED ORDER — SODIUM CHLORIDE 0.9 % IV SOLN
10.0000 mL/h | Freq: Once | INTRAVENOUS | Status: AC
  Administered 2015-09-02: 10 mL/h via INTRAVENOUS

## 2015-09-02 MED ORDER — PANTOPRAZOLE SODIUM 40 MG PO TBEC
40.0000 mg | DELAYED_RELEASE_TABLET | Freq: Every day | ORAL | Status: DC
  Administered 2015-09-02 – 2015-09-03 (×2): 40 mg via ORAL
  Filled 2015-09-02: qty 1

## 2015-09-02 MED ORDER — LATANOPROST 0.005 % OP SOLN
1.0000 [drp] | Freq: Every day | OPHTHALMIC | Status: DC
Start: 2015-09-02 — End: 2015-09-03
  Administered 2015-09-02: 1 [drp] via OPHTHALMIC
  Filled 2015-09-02 (×2): qty 2.5

## 2015-09-02 MED ORDER — SODIUM CHLORIDE 0.9 % IV SOLN
Freq: Once | INTRAVENOUS | Status: AC
  Administered 2015-09-02: 12:00:00 via INTRAVENOUS

## 2015-09-02 MED ORDER — ONDANSETRON HCL 4 MG/2ML IJ SOLN: 4.0000 mg | Freq: Four times a day (QID) | INTRAMUSCULAR | Status: DC | PRN

## 2015-09-02 MED ORDER — PANTOPRAZOLE SODIUM 40 MG PO TBEC
DELAYED_RELEASE_TABLET | ORAL | Status: AC
  Filled 2015-09-02: qty 1

## 2015-09-02 MED ORDER — SODIUM CHLORIDE 0.9 % IV BOLUS (SEPSIS): 1000.0000 mL | INTRAVENOUS | Status: AC

## 2015-09-02 MED ORDER — HYDROCODONE-ACETAMINOPHEN 5-325 MG PO TABS: 1.0000 | ORAL_TABLET | ORAL | Status: DC | PRN

## 2015-09-02 MED ORDER — TIMOLOL HEMIHYDRATE 0.5 % OP SOLN
1.0000 [drp] | Freq: Two times a day (BID) | OPHTHALMIC | Status: DC
  Administered 2015-09-02 – 2015-09-03 (×2): 1 [drp] via OPHTHALMIC
  Filled 2015-09-02 (×2): qty 10

## 2015-09-02 MED ORDER — SODIUM CHLORIDE 0.9 % IV SOLN: Freq: Once | INTRAVENOUS | Status: DC

## 2015-09-02 MED ORDER — DEXTROSE 5 % IV SOLN: 2.0000 g | Freq: Once | INTRAVENOUS | Status: DC

## 2015-09-02 MED ORDER — ALBUTEROL SULFATE HFA 108 (90 BASE) MCG/ACT IN AERS: 1.0000 | INHALATION_SPRAY | Freq: Four times a day (QID) | RESPIRATORY_TRACT | Status: DC | PRN

## 2015-09-02 MED ORDER — ALBUTEROL SULFATE (2.5 MG/3ML) 0.083% IN NEBU: 2.5000 mg | INHALATION_SOLUTION | Freq: Four times a day (QID) | RESPIRATORY_TRACT | Status: DC | PRN

## 2015-09-02 MED ORDER — SIMVASTATIN 20 MG PO TABS
20.0000 mg | ORAL_TABLET | Freq: Every day | ORAL | Status: DC
  Filled 2015-09-02: qty 1

## 2015-09-02 MED ORDER — HEPARIN SODIUM (PORCINE) 5000 UNIT/ML IJ SOLN: 5000.0000 [IU] | Freq: Three times a day (TID) | INTRAMUSCULAR | Status: DC

## 2015-09-02 MED ORDER — VANCOMYCIN HCL IN DEXTROSE 1-5 GM/200ML-% IV SOLN: 1000.0000 mg | Freq: Once | INTRAVENOUS | Status: DC

## 2015-09-02 MED ORDER — BISACODYL 5 MG PO TBEC: 5.0000 mg | DELAYED_RELEASE_TABLET | Freq: Every day | ORAL | Status: DC | PRN

## 2015-09-02 MED ORDER — DEXTROSE 5 % IV SOLN
2.0000 g | Freq: Two times a day (BID) | INTRAVENOUS | Status: DC
  Administered 2015-09-03: 2 g via INTRAVENOUS
  Filled 2015-09-02 (×2): qty 2

## 2015-09-02 MED ORDER — SIMETHICONE 80 MG PO CHEW
120.0000 mg | CHEWABLE_TABLET | Freq: Four times a day (QID) | ORAL | Status: DC | PRN
  Filled 2015-09-02: qty 2

## 2015-09-02 NOTE — ED Notes (Addendum)
Report received from trish - pt was brought from op where she was sent in for blood transfusion. It was found there that she was febrile and was sent in to er. Pt has med port which was accessed prior to arrival with fluids infusing at 20cc/hr. Labs previously drawn. Will need another set of cultures

## 2015-09-02 NOTE — Progress Notes (Signed)
Transferred to ER room 5 ,

## 2015-09-02 NOTE — Progress Notes (Signed)
Temp now 101.1 Dr. Ouida Sills notified , pt to be transported to the ER , spoke with ER charge nurse report given , pt. To be transported to room 5 , called and left message with A. Jerelene Redden pts contact person with info. Of pts location .

## 2015-09-02 NOTE — ED Notes (Signed)
Pt lives at [facility] and has cancer.  Pt was brought in by EMS to the [facility] for transfusion of blood products due to recent low hemaglobin levels.  Pt was noted to have fever prior to transfusion and was brought to the ED.  Pt temp on arrival to ED was 100.0 axillary.

## 2015-09-02 NOTE — Consult Note (Signed)
[facility] Consultation     ASSESSMENT/PLAN   INFECTIOUS A: Fever and leukocytosis of uncertain etiology, possible infection.  P:   Currently started on Vanco, cefepime, levaquin.  Await culture as adjust abx as needed.   PULMONARY Respiratory status stable.   RENAL A: --  GASTROINTESTINAL A:  Will start diet when amenable.   HEMATOLOGIC A:  Anemia, leukocytosis.  Breast cancer.  CLL.  P:  Transfuse per guidelines.    ENDOCRINE A:  --  OK to transfer to stepdown/floor.   ---------------------------------------  ---------------------------------------   Name: Janet Pope MRN: [id number] DOB: 1927-05-31    ADMISSION DATE:  08/18/2015 CONSULTATION DATE: 08/25/2015   REFERRING MD :  Dr. Manuella Ghazi  CHIEF COMPLAINT: Fever   HISTORY OF PRESENT ILLNESS:    The patient is an 82 yo female with history of brca, CLL, on chemo. She presented from the special procedures where was scheduled to have 2 u PRBC transfusion. She had a fever of 101. She has a history of MRSA. UTI.    CXR; mild atelectasis, no acute changes noted.  Labs reviewed, no evidence of renal failure, lactic acid within normal range.   PAST MEDICAL HISTORY :  Past Medical History  Diagnosis Date  . HLD (hyperlipidemia)   . GERD (gastroesophageal reflux disease)   . Kidney stone   . H/O lumpectomy   . Obstructive sleep apnea   . Gout   . Diverticulosis   . Nephrolithiasis     history of right lithotripsy  . History of valvular heart disease   . HTN (hypertension)   . Breast cancer ([place])   . CLL (chronic lymphocytic leukemia) ([place])   . Stomach cancer ([place])   . Gout   . CHF (congestive heart failure) ([place])   . Arthritis    Past Surgical History  Procedure Laterality Date  . Total abdominal hysterectomy w/ bilateral salpingoophorectomy    . Breast lumpectomy    . Partial hip arthroplasty      right  . Cataract extraction, bilateral    . Lithotripsy      right    . Portacath placement    . Abdominal hysterectomy     Prior to Admission medications   Medication Sig Start Date End Date Taking? Authorizing Provider  albuterol (PROVENTIL HFA;VENTOLIN HFA) 108 (90 Base) MCG/ACT inhaler Inhale 1-2 puffs into the lungs every 6 (six) hours as needed for wheezing or shortness of breath.   Yes Historical Provider, MD  carvedilol (COREG) 25 MG tablet Take 25 mg by mouth 2 (two) times daily with a meal.    Yes Historical Provider, MD  HYDROcodone-acetaminophen (NORCO) 5-325 MG tablet Take 1-2 tablets by mouth every 4 (four) hours as needed for moderate pain. 08/26/15  Yes Nicholes Mango, MD  latanoprost (XALATAN) 0.005 % ophthalmic solution Place 1 drop into both eyes at bedtime. Reported on 08/25/2015   Yes Historical Provider, MD  omeprazole (PRILOSEC) 20 MG capsule Take 20 mg by mouth daily.   Yes Historical Provider, MD  simethicone (MYLICON) 80 MG chewable tablet Chew 120 mg by mouth every 6 (six) hours as needed.   Yes Historical Provider, MD  simvastatin (ZOCOR) 20 MG tablet Take 20 mg by mouth at bedtime.    Yes Historical Provider, MD  sulfamethoxazole-trimethoprim (BACTRIM,SEPTRA) 400-80 MG tablet Take 1 tablet by mouth every 12 (twelve) hours. 09/01/15 09/06/15 Yes Historical Provider, MD  timolol (BETIMOL) 0.5 % ophthalmic solution Place 1 drop into both eyes  2 (two) times daily.    Yes Historical Provider, MD   Allergies  Allergen Reactions  . Aspirin Shortness Of Breath  . Ace Inhibitors Other (See Comments)    Reaction:  Unknown   . Eggs Or Egg-Derived Products Other (See Comments)    Reaction:  Unknown   . Milk-Related Compounds Other (See Comments)    Reaction:  Unknown   . Whey Other (See Comments)    Reaction:  Unknown     FAMILY HISTORY:  Family History  Problem Relation Age of Onset  . Hypertension Sister    SOCIAL HISTORY:  reports that she has never smoked. She does not have any smokeless tobacco history on file. She reports that she  does not drink alcohol or use illicit drugs.  REVIEW OF SYSTEMS:   Constitutional: Feels well. Cardiovascular: No chest pain.  Pulmonary: Denies dyspnea.   The remainder of systems were reviewed and were found to be negative other than what is documented in the HPI.    VITAL SIGNS: Temp:  [100 F (37.8 C)] 100 F (37.8 C) (03/23 1125) Pulse Rate:  [25-100] 91 (03/23 1531) Resp:  [19-34] 24 (03/23 1531) BP: (116-142)/(51-88) 138/88 mmHg (03/23 1531) SpO2:  [96 %-98 %] 97 % (03/23 1531) Weight:  [150 lb (68.04 kg)] 150 lb (68.04 kg) (03/23 1125) HEMODYNAMICS:   VENTILATOR SETTINGS:   INTAKE / OUTPUT: No intake or output data in the 24 hours ending 08/21/2015 1610  Physical Examination:   VS: BP 138/88 mmHg  Pulse 91  Temp(Src) 100 F (37.8 C) (Axillary)  Resp 24  Ht 5' 3"  (1.6 m)  Wt 150 lb (68.04 kg)  BMI 26.58 kg/m2  SpO2 97%  General Appearance: No distress  Neuro:without focal findings, mental status normal.  HEENT: PERRLA, EOM intact, no ptosis, no other lesions noticed;  Pulmonary: normal breath sounds.. CardiovascularNormal S1,S2.  No m/r/g.    Abdomen: Benign, Soft, non-tender, No masses. Renal:  No costovertebral tenderness  GU:  Not performed at this time. Endoc: No evident thyromegaly, no signs of acromegaly. Skin:   warm, no rashes, no ecchymosis  Extremities: normal, no cyanosis, clubbing, no edema, warm with normal capillary refill.    LABS: Reviewed   LABORATORY PANEL:   CBC  Recent Labs Lab 08/23/2015 1147  WBC 22.8*  HGB 7.1*  HCT 21.6*  PLT 209    Chemistries   Recent Labs Lab 08/27/15 0631  08/16/2015 1147  NA 134*  < > 140  K 4.4  < > 4.0  CL 108  < > 110  CO2 18*  < > 24  GLUCOSE 140*  < > 150*  BUN 21*  < > 37*  CREATININE 1.16*  < > 0.97  CALCIUM 8.7*  < > 10.0  MG 1.6*  --   --   AST  --   < > 16  ALT  --   < > 21  ALKPHOS  --   < > 73  BILITOT  --   < > 5.0*  < > = values in this interval not displayed.   Recent  Labs Lab 09/05/2015 0838  GLUCAP 148*   No results for input(s): PHART, PCO2ART, PO2ART in the last 168 hours.  Recent Labs Lab 08/31/15 0656 09/01/15 0815 08/22/2015 1147  AST 18 24 16   ALT 17 22 21   ALKPHOS 77 77 73  BILITOT 3.3* 4.4* 5.0*  ALBUMIN 1.9* 1.8* 1.6*    Cardiac Enzymes  Recent Labs Lab  09/01/2015 1147  TROPONINI <0.03    RADIOLOGY:  Dg Chest Port 1 View  08/27/2015  CLINICAL DATA:  Fever EXAM: PORTABLE CHEST 1 VIEW COMPARISON:  08/21/2015 FINDINGS: Stable right jugular Port-A-Cath. Normal heart size. Low lung volumes. Bibasilar atelectasis versus airspace disease right greater than left. No pneumothorax. IMPRESSION: Low lung volumes with bibasilar atelectasis versus airspace disease right greater than left. Electronically Signed   By: Marybelle Killings M.D.   On: 08/15/2015 11:09       --Marda Stalker, MD.  Board Certified in Internal Medicine, Pulmonary Medicine, [place], and Sleep Medicine.  [facility]  Patricia Pesa, M.D.  Vilinda Boehringer, M.D.  Merton Border, M.D   08/17/2015, 4:10 PM

## 2015-09-02 NOTE — ED Notes (Signed)
ekg obtaiined, fluids from op area stopped and blood redrawn as ordered. Pt placed back on monitor.

## 2015-09-02 NOTE — Progress Notes (Signed)
Call to Dr. Sharyon Cable office to notify them of temp. 100.3  Prior to blood adm.

## 2015-09-02 NOTE — Progress Notes (Signed)
Pharmacy Antibiotic Note  Janet Pope is a 82 y.o. female admitted on 09/03/2015 with pneumonia/sepsis.  Pharmacy has been consulted for vancomycin & cefepime dosing.  Plan: Cefepime 2 g IV q 12 hours  Vancomycin 1000 mg IV x 1 dose given in ED. Will follow with vancomycin 750 mg IV daily (with stacked dose to begin 8 hours after initial dose at 0100 tomorrow morning) Goal vancomycin trough 15-20 mcg/mL Vancomycin trough ordered for 3/26 @ 0030  Kinetics: Based on adjusted body weight of 63.4 kg Ke: 0.038 Half-life: 18.2 hrs Vd: 44.3 L   Height: 5\' 5"  (165.1 cm) Weight: 160 lb 15 oz (73 kg) IBW/kg (Calculated) : 57  Temp (24hrs), Avg:99.6 F (37.6 C), Min:99.3 F (37.4 C), Max:100 F (37.8 C)   Recent Labs Lab 08/27/15 0631 08/30/15 0956 08/31/15 0656 09/01/15 0815 09/06/2015 1147 08/14/2015 1148  WBC  --  15.6* 18.3* 22.0* 22.8*  --   CREATININE 1.16* 0.95 0.88 0.99 0.97  --   LATICACIDVEN  --   --   --   --   --  1.4    Estimated Creatinine Clearance: 41.7 mL/min (by C-G formula based on Cr of 0.97).    Allergies  Allergen Reactions  . Aspirin Shortness Of Breath  . Ace Inhibitors Other (See Comments)    Reaction:  Unknown   . Eggs Or Egg-Derived Products Other (See Comments)    Reaction:  Unknown   . Milk-Related Compounds Other (See Comments)    Reaction:  Unknown   . Whey Other (See Comments)    Reaction:  Unknown    Thank you for allowing pharmacy to be a part of this patient's care.  Lenis Noon, PharmD 09/01/2015 6:57 PM

## 2015-09-02 NOTE — H&P (Addendum)
[facility] [place] NAME: Janet Pope    MR#:  [id number]  DATE OF BIRTH:  10/08/1926  DATE OF ADMISSION:  08/18/2015  PRIMARY CARE PHYSICIAN: Frazier Richards MD  REQUESTING/REFERRING PHYSICIAN: Lenise Arena MD  CHIEF COMPLAINT:   Chief Complaint  Patient presents with  . Fever    HISTORY OF PRESENT ILLNESS:  Janet Pope  is a 82 y.o. female with a known history of CLL, breast CA transferred from special procedures this morning to ED where she was scheduled to have 2 units of blood. She reportedly has a history of anemia. She was found to have a fever today to 101, currently she states she feels weak area she does have history of UTI and this was found to be MRSA.She denies other complaints at this time. EDP concerned about sepsis considering fever, leukocytosis, tachypnea with possible pneumonia. PAST MEDICAL HISTORY:   Past Medical History  Diagnosis Date  . HLD (hyperlipidemia)   . GERD (gastroesophageal reflux disease)   . Kidney stone   . H/O lumpectomy   . Obstructive sleep apnea   . Gout   . Diverticulosis   . Nephrolithiasis     history of right lithotripsy  . History of valvular heart disease   . HTN (hypertension)   . Breast cancer ([place])   . CLL (chronic lymphocytic leukemia) ([place])   . Stomach cancer ([place])   . Gout   . CHF (congestive heart failure) ([place])   . Arthritis    PAST SURGICAL HISTORY:   Past Surgical History  Procedure Laterality Date  . Total abdominal hysterectomy w/ bilateral salpingoophorectomy    . Breast lumpectomy    . Partial hip arthroplasty      right  . Cataract extraction, bilateral    . Lithotripsy      right  . Portacath placement    . Abdominal hysterectomy      SOCIAL HISTORY:   Social History  Substance Use Topics  . Smoking status: Never Smoker   . Smokeless tobacco: Not on file  . Alcohol Use: No    FAMILY HISTORY:   Family History  Problem  Relation Age of Onset  . Hypertension Sister     DRUG ALLERGIES:   Allergies  Allergen Reactions  . Aspirin Shortness Of Breath  . Ace Inhibitors Other (See Comments)    Reaction:  Unknown   . Eggs Or Egg-Derived Products Other (See Comments)    Reaction:  Unknown   . Milk-Related Compounds Other (See Comments)    Reaction:  Unknown   . Whey Other (See Comments)    Reaction:  Unknown     REVIEW OF SYSTEMS:   Review of Systems  Constitutional: Positive for fever and malaise/fatigue. Negative for weight loss and diaphoresis.  HENT: Negative for ear discharge, ear pain, hearing loss, nosebleeds, sore throat and tinnitus.   Eyes: Negative for blurred vision and pain.  Respiratory: Negative for cough, hemoptysis, shortness of breath and wheezing.   Cardiovascular: Negative for chest pain, palpitations, orthopnea and leg swelling.  Gastrointestinal: Negative for heartburn, nausea, vomiting, abdominal pain, diarrhea, constipation and blood in stool.  Genitourinary: Negative for dysuria, urgency and frequency.  Musculoskeletal: Negative for myalgias and back pain.  Skin: Negative for itching and rash.  Neurological: Positive for weakness. Negative for dizziness, tingling, tremors, focal weakness, seizures and headaches.  Psychiatric/Behavioral: Negative for depression. The patient is not nervous/anxious.    MEDICATIONS AT HOME:  Prior to Admission medications   Medication Sig Start Date End Date Taking? Authorizing Provider  albuterol (PROVENTIL HFA;VENTOLIN HFA) 108 (90 Base) MCG/ACT inhaler Inhale 1-2 puffs into the lungs every 6 (six) hours as needed for wheezing or shortness of breath.   Yes Historical Provider, MD  carvedilol (COREG) 25 MG tablet Take 25 mg by mouth 2 (two) times daily with a meal.    Yes Historical Provider, MD  HYDROcodone-acetaminophen (NORCO) 5-325 MG tablet Take 1-2 tablets by mouth every 4 (four) hours as needed for moderate pain. 08/26/15  Yes Nicholes Mango,  MD  latanoprost (XALATAN) 0.005 % ophthalmic solution Place 1 drop into both eyes at bedtime. Reported on 08/25/2015   Yes Historical Provider, MD  omeprazole (PRILOSEC) 20 MG capsule Take 20 mg by mouth daily.   Yes Historical Provider, MD  simethicone (MYLICON) 80 MG chewable tablet Chew 120 mg by mouth every 6 (six) hours as needed.   Yes Historical Provider, MD  simvastatin (ZOCOR) 20 MG tablet Take 20 mg by mouth at bedtime.    Yes Historical Provider, MD  sulfamethoxazole-trimethoprim (BACTRIM,SEPTRA) 400-80 MG tablet Take 1 tablet by mouth every 12 (twelve) hours. 09/01/15 09/06/15 Yes Historical Provider, MD  timolol (BETIMOL) 0.5 % ophthalmic solution Place 1 drop into both eyes 2 (two) times daily.    Yes Historical Provider, MD      VITAL SIGNS:  Blood pressure 124/51, pulse 25, temperature 100 F (37.8 C), temperature source Axillary, resp. rate 24, height 5\' 3"  (1.6 m), weight 68.04 kg (150 lb), SpO2 96 %. PHYSICAL EXAMINATION:  Physical Exam  Constitutional: She is oriented to person, place, and time. She appears malnourished. She appears unhealthy. She has a sickly appearance.  HENT:  Head: Normocephalic and atraumatic.  Eyes: Conjunctivae and EOM are normal. Pupils are equal, round, and reactive to light.  Neck: Normal range of motion. Neck supple. No tracheal deviation present. No thyromegaly present.  Cardiovascular: Normal rate, regular rhythm and normal heart sounds.   Pulmonary/Chest: Effort normal. No respiratory distress. She has decreased breath sounds in the right lower field and the left lower field. She has no wheezes. She has rhonchi in the right lower field. She exhibits no tenderness.  Abdominal: Soft. Bowel sounds are normal. She exhibits no distension. There is no tenderness.  Musculoskeletal: Normal range of motion.  Neurological: She is alert and oriented to person, place, and time. No cranial nerve deficit.  Skin: Skin is warm and dry. No rash noted. There is  pallor.  Psychiatric: Mood and affect normal.   LABORATORY PANEL:   CBC  Recent Labs Lab 09/06/2015 1147  WBC 22.8*  HGB 7.1*  HCT 21.6*  PLT 209   ------------------------------------------------------------------------------------------------------------------  Chemistries   Recent Labs Lab 08/27/15 0631  08/28/2015 1147  NA 134*  < > 140  K 4.4  < > 4.0  CL 108  < > 110  CO2 18*  < > 24  GLUCOSE 140*  < > 150*  BUN 21*  < > 37*  CREATININE 1.16*  < > 0.97  CALCIUM 8.7*  < > 10.0  MG 1.6*  --   --   AST  --   < > 16  ALT  --   < > 21  ALKPHOS  --   < > 73  BILITOT  --   < > 5.0*  < > = values in this interval not displayed. ------------------------------------------------------------------------------------------------------------------  Cardiac Enzymes  Recent Labs Lab 09/04/2015 1147  TROPONINI <0.03   ------------------------------------------------------------------------------------------------------------------  RADIOLOGY:  Dg Chest Port 1 View  08/13/2015  CLINICAL DATA:  Fever EXAM: PORTABLE CHEST 1 VIEW COMPARISON:  08/21/2015 FINDINGS: Stable right jugular Port-A-Cath. Normal heart size. Low lung volumes. Bibasilar atelectasis versus airspace disease right greater than left. No pneumothorax. IMPRESSION: Low lung volumes with bibasilar atelectasis versus airspace disease right greater than left. Electronically Signed   By: Marybelle Killings M.D.   On: 09/05/2015 11:09   IMPRESSION AND PLAN:  82 y.o. Female with known history of CLL, breast CA transferred from special procedures this morning to ED where she was scheduled to have 2 units of blood  * Clinical Sepsis: present on admission, sepsis protocol. abx for possible pneumonia. Intensivist c/s (d/w them), also has recent h/o MRSA uti. Recent echo on 3/14 showed normal LVEF  * Anemia: Hb down to 7.1 - no obvious bleeding. Will order 2 PRBC as was scheduled. Check Anemia Panel, GI & Onco c/s - Monitor  *  Health-care associate Pneumonia: abx per protocol. PCCM c/s  * CLL: mgmt per onco - Onco c/s    All the records are reviewed and case discussed with ED provider. Management plans discussed with the patient, family and they are in agreement.  CODE STATUS: DNR, will c/s palliative care  TOTAL TIME (critical care) TAKING CARE OF THIS PATIENT: 45 minutes.    [facility], Farran Amsden M.D on 09/09/2015 at 2:18 PM  Between 7am to 6pm - Pager - [phone]  After 6pm go to [email or web address] - password [email or web address] [facility]  [facility]  Office  [phone]  CC: Primary care physician; Frazier Richards MD  Note: This dictation was prepared with Dragon dictation along with smaller phrase technology. Any transcriptional errors that result from this process are unintentional.

## 2015-09-02 NOTE — ED Provider Notes (Signed)
[facility] Emergency Department Provider Note     Time seen: ----------------------------------------- 10:26 AM on 08/31/2015 -----------------------------------------    I have reviewed the triage vital signs and the nursing notes.   HISTORY  Chief Complaint No chief complaint on file.    HPI Janet Pope is a 82 y.o. female who presents to ER coming from special procedures this morning where she was scheduled to have 2 units of blood. She reportedly has a history of anemia. She was found to have a fever today to 101, currently she states she feels weak area she does have history of UTI and this was found to be MRSA.She denies other complaints at this time.   Past Medical History  Diagnosis Date  . HLD (hyperlipidemia)   . GERD (gastroesophageal reflux disease)   . Kidney stone   . H/O lumpectomy   . Obstructive sleep apnea   . Gout   . Diverticulosis   . Nephrolithiasis     history of right lithotripsy  . History of valvular heart disease   . HTN (hypertension)   . Breast cancer ([place])   . CLL (chronic lymphocytic leukemia) ([place])   . Stomach cancer ([place])   . Gout   . CHF (congestive heart failure) ([place])   . Arthritis     Patient Active Problem List   Diagnosis Date Noted  . 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 Gout   . Mitral stenosis 10/03/2012  . Palpitations 10/03/2012  . Dyspnea 08/02/2012  . HTN (hypertension)     Past Surgical History  Procedure Laterality Date  . Total abdominal hysterectomy w/ bilateral salpingoophorectomy    . Breast lumpectomy    . Partial hip arthroplasty      right  . Cataract extraction, bilateral    . Lithotripsy      right  . Portacath placement    . Abdominal hysterectomy      Allergies Aspirin; Ace inhibitors; Eggs or egg-derived products; Milk-related compounds; and Whey  Social History Social History  Substance Use Topics  . Smoking status: Never Smoker   . Smokeless tobacco:  Not on file  . Alcohol Use: No    Review of Systems Constitutional:Positive for fever Eyes: Negative for visual changes. ENT: Negative for sore throat. Cardiovascular: Negative for chest pain. Respiratory: Negative for shortness of breath. Gastrointestinal: Negative for abdominal pain, vomiting and diarrhea. Genitourinary: Negative for dysuria. Musculoskeletal: Negative for back pain. Skin: Negative for rash. Neurological: Positive for weakness  10-point ROS otherwise negative.  ____________________________________________   PHYSICAL EXAM:  VITAL SIGNS: ED Triage Vitals  Enc Vitals Group     BP --      Pulse --      Resp --      Temp --      Temp src --      SpO2 --      Weight --      Height --      Head Cir --      Peak Flow --      Pain Score --      Pain Loc --      Pain Edu? --      Excl. in [place]? --     Constitutional: Alert and oriented. Lethargic, no distress Eyes: Pale conjunctiva, scleral icterus PERRL. Normal extraocular movements. ENT   Head: Normocephalic and atraumatic.   Nose: No congestion/rhinnorhea.   Mouth/Throat: Mucous membranes are moist.   Neck: No stridor. Cardiovascular: Normal  rate, regular rhythm. Normal and symmetric distal pulses are present in all extremities. No murmurs, rubs, or gallops. Respiratory: Normal respiratory effort without tachypnea nor retractions. Breath sounds are clear and equal bilaterally. No wheezes/rales/rhonchi. Gastrointestinal: Soft and nontender. No distention. No abdominal bruits.  Musculoskeletal: Nontender with normal range of motion in all extremities.  Neurologic:  Normal speech and language. Generalized weakness, nothing focal. Skin:  Skin is warm, dry and intact. Pallor is noted Psychiatric: Mood and affect are normal. ____________________________________________  EKG: Interpreted by me.Normal sinus rhythm with a rate of 95 bpm, normal. We'll, normal QRS, normal QT interval. LVH, normal  axis.  ____________________________________________  ED COURSE:  Pertinent labs & imaging results that were available during my care of the patient were reviewed by me and considered in my medical decision making (see chart for details). Patient resents with generalized weakness, known anemia and fever. We will assess with sepsis protocol ____________________________________________    LABS (pertinent positives/negatives)  Labs Reviewed  COMPREHENSIVE METABOLIC PANEL - Abnormal; Notable for the following:    Glucose, Bld 150 (*)    BUN 37 (*)    Total Protein 5.4 (*)    Albumin 1.6 (*)    Total Bilirubin 5.0 (*)    GFR calc non Af Amer 51 (*)    GFR calc Af Amer 60 (*)    All other components within normal limits  CBC WITH DIFFERENTIAL/PLATELET - Abnormal; Notable for the following:    WBC 22.8 (*)    RBC 2.79 (*)    Hemoglobin 7.1 (*)    HCT 21.6 (*)    MCV 77.4 (*)    MCH 25.3 (*)    RDW 20.4 (*)    Neutro Abs 22.1 (*)    Lymphs Abs 0.1 (*)    All other components within normal limits  BLOOD GAS, VENOUS - Abnormal; Notable for the following:    pCO2, Ven 39 (*)    All other components within normal limits  CULTURE, BLOOD (ROUTINE X 2)  CULTURE, BLOOD (ROUTINE X 2)  URINE CULTURE  LACTIC ACID, PLASMA  TROPONIN I  LACTIC ACID, PLASMA  URINALYSIS COMPLETEWITH MICROSCOPIC ([facility] ONLY)   CRITICAL CARE Performed by: Carleene, Clar   Total critical care time: 30 minutes  Critical care time was exclusive of separately billable procedures and treating other patients.  Critical care was necessary to treat or prevent imminent or life-threatening deterioration.  Critical care was time spent personally by me on the following activities: development of treatment plan with patient and/or surrogate as well as nursing, discussions with consultants, evaluation of patient's response to treatment, examination of patient, obtaining history from patient or surrogate, ordering  and performing treatments and interventions, ordering and review of laboratory studies, ordering and review of radiographic studies, pulse oximetry and re-evaluation of patient's condition.  RADIOLOGY Images were viewed by me  Chest x-ray IMPRESSION: Low lung volumes with bibasilar atelectasis versus airspace disease right greater than left. ____________________________________________  FINAL ASSESSMENT AND PLAN  Fever, anemia, Possible pneumonia, CLL  Plan: Patient with labs and imaging as dictated above. Patient is no acute distress, possible pneumonia. Family reports she has had a cough, this is possibly the source for fever. Urine is pending at this time. She's received IV Levaquin, urine and blood cultures have been sent. I have ordered blood for her as well for her anemia. She is stable for admission.   Earleen Newport, MD   Earleen Newport, MD 08/31/[id number]

## 2015-09-03 DIAGNOSIS — I050 Rheumatic mitral stenosis: Secondary | ICD-10-CM

## 2015-09-03 DIAGNOSIS — F0390 Unspecified dementia without behavioral disturbance: Secondary | ICD-10-CM

## 2015-09-03 DIAGNOSIS — E43 Unspecified severe protein-calorie malnutrition: Secondary | ICD-10-CM

## 2015-09-03 DIAGNOSIS — Z515 Encounter for palliative care: Secondary | ICD-10-CM

## 2015-09-03 DIAGNOSIS — M1990 Unspecified osteoarthritis, unspecified site: Secondary | ICD-10-CM

## 2015-09-03 DIAGNOSIS — D649 Anemia, unspecified: Secondary | ICD-10-CM | POA: Insufficient documentation

## 2015-09-03 DIAGNOSIS — Z66 Do not resuscitate: Secondary | ICD-10-CM

## 2015-09-03 DIAGNOSIS — Z87440 Personal history of urinary (tract) infections: Secondary | ICD-10-CM

## 2015-09-03 LAB — IRON AND TIBC
IRON: 8 ug/dL — AB (ref 28–170)
SATURATION RATIOS: UNDETERMINED % (ref 10.4–31.8)
TIBC: UNDETERMINED ug/dL (ref 250–450)
UIBC: UNDETERMINED ug/dL

## 2015-09-03 LAB — BASIC METABOLIC PANEL
Anion gap: 9 (ref 5–15)
BUN: 39 mg/dL — AB (ref 6–20)
CO2: 21 mmol/L — ABNORMAL LOW (ref 22–32)
CREATININE: 1.04 mg/dL — AB (ref 0.44–1.00)
Calcium: 9.9 mg/dL (ref 8.9–10.3)
Chloride: 110 mmol/L (ref 101–111)
GFR calc Af Amer: 55 mL/min — ABNORMAL LOW (ref >60)
GFR, EST NON AFRICAN AMERICAN: 47 mL/min — AB (ref >60)
Glucose, Bld: 174 mg/dL — ABNORMAL HIGH (ref 65–99)
Potassium: 4 mmol/L (ref 3.5–5.1)
SODIUM: 140 mmol/L (ref 135–145)

## 2015-09-03 LAB — PROCALCITONIN: PROCALCITONIN: 4.94 ng/mL

## 2015-09-03 LAB — CBC
HCT: 33.2 % — ABNORMAL LOW (ref 35.0–47.0)
Hemoglobin: 11.1 g/dL — ABNORMAL LOW (ref 12.0–16.0)
MCH: 26.7 pg (ref 26.0–34.0)
MCHC: 33.5 g/dL (ref 32.0–36.0)
MCV: 79.6 fL — ABNORMAL LOW (ref 80.0–100.0)
PLATELETS: 228 10*3/uL (ref 150–440)
RBC: 4.17 MIL/uL (ref 3.80–5.20)
RDW: 19.1 % — AB (ref 11.5–14.5)
WBC: 22.8 10*3/uL — ABNORMAL HIGH (ref 3.6–11.0)

## 2015-09-03 LAB — BLOOD GAS, VENOUS
PATIENT TEMPERATURE: 37
pCO2, Ven: 39 mmHg — ABNORMAL LOW (ref 44.0–60.0)
pH, Ven: 7.43 (ref 7.320–7.430)

## 2015-09-03 LAB — FERRITIN: FERRITIN: 2441 ng/mL — AB (ref 11–307)

## 2015-09-03 LAB — BILIRUBIN, DIRECT: Bilirubin, Direct: 5.3 mg/dL — ABNORMAL HIGH (ref 0.1–0.5)

## 2015-09-03 LAB — PREPARE RBC (CROSSMATCH)

## 2015-09-03 LAB — TROPONIN I
Troponin I: <0.03 ng/mL (ref <0.031)
Troponin I: <0.03 ng/mL (ref <0.031)

## 2015-09-03 LAB — HEMOGLOBIN A1C

## 2015-09-03 LAB — RETICULOCYTES
RBC.: 4.19 MIL/uL (ref 3.80–5.20)
Retic Count, Absolute: 41.9 10*3/uL (ref 19.0–183.0)
Retic Ct Pct: 1 % (ref 0.4–3.1)

## 2015-09-03 LAB — FOLATE: FOLATE: 4.8 ng/mL — AB (ref >5.9)

## 2015-09-03 LAB — GLUCOSE, CAPILLARY: Glucose-Capillary: 126 mg/dL — ABNORMAL HIGH (ref 65–99)

## 2015-09-03 LAB — VITAMIN B12: Vitamin B-12: 197 pg/mL (ref 180–914)

## 2015-09-03 MED ORDER — PROCHLORPERAZINE 25 MG RE SUPP: 25.0000 mg | Freq: Three times a day (TID) | RECTAL | Status: AC | PRN

## 2015-09-03 MED ORDER — MUPIROCIN CALCIUM 2 % EX CREA
TOPICAL_CREAM | Freq: Two times a day (BID) | CUTANEOUS | Status: DC
Start: 2015-09-03 — End: 2015-09-07
  Administered 2015-09-03 – 2015-09-06 (×8): via TOPICAL
  Filled 2015-09-03 (×2): qty 15

## 2015-09-03 MED ORDER — ALBUTEROL SULFATE (2.5 MG/3ML) 0.083% IN NEBU: 2.5000 mg | INHALATION_SOLUTION | RESPIRATORY_TRACT | Status: AC | PRN

## 2015-09-03 MED ORDER — BISACODYL 10 MG RE SUPP: 10.0000 mg | Freq: Every day | RECTAL | Status: AC | PRN

## 2015-09-03 MED ORDER — LORAZEPAM 0.5 MG PO TABS: 0.5000 mg | ORAL_TABLET | ORAL | Status: AC | PRN

## 2015-09-03 MED ORDER — MORPHINE SULFATE (CONCENTRATE) 10 MG/0.5ML PO SOLN
5.0000 mg | ORAL | Status: DC | PRN
  Administered 2015-09-04 – 2015-09-06 (×3): 5 mg via ORAL
  Filled 2015-09-03 (×4): qty 1

## 2015-09-03 MED ORDER — VANCOMYCIN HCL IN DEXTROSE 750-5 MG/150ML-% IV SOLN
750.0000 mg | INTRAVENOUS | Status: DC
  Filled 2015-09-03 (×2): qty 150

## 2015-09-03 MED ORDER — PROCHLORPERAZINE 25 MG RE SUPP
25.0000 mg | Freq: Three times a day (TID) | RECTAL | Status: DC | PRN
  Filled 2015-09-03: qty 1

## 2015-09-03 MED ORDER — MORPHINE SULFATE (CONCENTRATE) 10 MG/0.5ML PO SOLN: 5.0000 mg | ORAL | Status: AC | PRN

## 2015-09-03 MED ORDER — ACETAMINOPHEN 650 MG RE SUPP: 650.0000 mg | Freq: Four times a day (QID) | RECTAL | Status: AC | PRN

## 2015-09-03 MED ORDER — LORAZEPAM 0.5 MG PO TABS: 0.5000 mg | ORAL_TABLET | ORAL | Status: DC | PRN

## 2015-09-03 MED ORDER — DEXTROSE 5 % IV SOLN
1.0000 g | INTRAVENOUS | Status: DC
  Administered 2015-09-03: 1 g via INTRAVENOUS
  Filled 2015-09-03 (×2): qty 10

## 2015-09-03 NOTE — Care Management (Addendum)
Patient with recent discharge from [facility] to [facility].  She presented back to ED from the cancer center where she was  to have received outpatient transfusion for chronic anemia.  Found to be febrile.  Admitted to icu due to concerns for sepsis.  When asked, patient acknowledged she was from [place].  Other than that, she verbalized very little.  Appears very lethargic.  GI, oncology and palliative care consults are pending.  Referral to CSW

## 2015-09-03 NOTE — Consult Note (Signed)
Palliative Medicine Inpatient Consult Note   Name: Janet Pope Date: 09/03/2015 MRN: [id number]  DOB: 1926-10-27  Referring Physician: Epifanio Lesches, MD  Palliative Care consult requested for this 82 y.o. female for goals of medical therapy in patient with fever and anemia and chronic illnesses as detailed below.  TODAY'S DISCUSSIONS AND DECISIONS: 1.  Pt is DNR  2.  She speaks but is confused and not able to make her own health care decisions at this time.  Ct of head suggests small vessel disease advanced for age --which hints at the presence of a dementia which is likely present given clinical documentation during her recent hospital stays.  She is confused.   3.  I met with her two involved siblings, a brother and sister.  Pt's brother lives here in [place] and he has agreed to become the primary contact and Media planner for pt --with permission of the sister who is here.  There is a third sibling (a sister) who lives in [place] who has not responded to phone calls from this brother and sister and she has not been involved in pts life in recent years.  The sister who is present lives in [place] but was driven up here to stay in pts home by her daughter.  She is staying several more days.  Both the brother and sister are elderly themselves and both use a cane but both are cognitively intact and appropriately concerned and informed about pt's condition and poor prognosis.    4.  However, neither sibling has known much about pt's recent or remote medical history.  Pt kept things to herself and took care of herself without involving others until she got to this point where she cannot do so anymore.  They have lived in different states for many years and they did not know that pt had breast cancer when she was in [place].  Her brother knew she had some kind of cancer but he is unaware of any details or treatments. He thinks she had treatments at the cancer center.    5.  I cannot  access any details of treatments pt received at the cancer center. There are entries for visits to the [facility], but notes cannot be viewed.  There is one entry from the [facility] that records that she was there regarding repeat CT for 'restaging lymphoma'.  The CT for that date, 05/19/2014, showed no evidence of recurrent lymphoma at that time.   The next appointment she had at the [facility] was in January 2016 --but we have no notes from that or any other visit there.   6. Pt is not eating well and hasn't for weeks. I suspect it has been for much longer.  She is VERY severely malnourished with an albumin of 1.5. She only takes a few bites of applesauce and some sips of clear liquids including juice and water. She hasn't eaten well for a while now given this low albumin. We don't have weight loss data.   7. BNP was elevated indicating a component of CHF and records show that she has had known diastolic CHF in the past.   She has been short of breath but this is somewhat stable at the moment.  She is on nasal cannula oxygen --as needed.     7.  Family asked about options.  They have a sense that she is trying to die and they are at peace about this, though saddened.  Pt had  been  very involved in her church and they all feel she is ready to meet her maker any day now.  Her very profound malnutrition qualifies her for Hospice. Her lack of current adequate intake indicates that she is appropriate for Hospice Home.  Her HOSPICE DIAGNOSIS is Severe Malnutrition.  We do not know if her Lymphoma is recurrent but it the Chronic Leukemia component has apparently persisted and her profound anemia which may be multifactorial (malnutrition related, iron and B12 deficiency related, and CLL related) is quite severe and indicates that she has an immune system that likely does not function well at all.   Her pneumonia/sepsis is not likely to be definitively treated given her lack of a properly functioning immune  system.   8.  After explaining various options, with Chaplain present and supportive during our talk, the family wishes for [place] to pursue Hospice Home placement this weekend. Again, it is Friday evening and this will have to be set up over the weekend.  In the meantime, family agrees to an overall comfort care approach. I plan to leave some meds and eliminate some others --and adding comfort medications.  Pt has not had treatment for pain, and is not in pain now, but she has been short of breath and she also has apparently had some signs of pain at times. Will order symptom management meds. She is profoundly weak.  I have updated her attending and critical care staff and nursing.  Family has gone home but will return tomorrow.   BRIEF HISTORY: Pt is an 82 yo woman who was transferred from the cancer center (where she was to get transfused for her chronic anemia related to her CLL)  on 3/23 to the ED due to a fever of 101 degrees.  She felt week and has a h/o UTI's.  She met criteria for sepsis and was thought to have pneumonia and sepsis.  Pt had a MRSA UTI recently diagnosed and treated. Hemoglobin was 7.1 w/o signs of bleeding.  Transfusion of 2 units was ordered.   She was just discharged from here on  3/17 after being here for a few days with the diagnosis of a TIA. She had some slurred speech and it was thought that her altered mental status was related to renal failure from dehydration.  An echo showed a nl EF and no embolic source.  Carotid dopplers showed mild stenosis only and CT and MRI of the brain were negative for acute events.   She was transfused one unit and given fluids and improved and then she was sent to [facility] for a rehab stay.  She had had a recent 'cough and fever' diagnosed during a visit to the Ed on 3/11 and Levaquin had been started and then [facility].  She had not been eating well.    During her prior admission, SLP recommended Mech soft / reg diet with aspiration  precautions and tray setup for all meals.  Pt was oriented only to self and city and thought she was at [facility] during that stay.  She was unsafe to live alone as she had been.  Pts brother appears to be main contact. She never had children and is a widow.   Pts breast cancer was treated in [place] more than 6 years ago. She had been seen by Dr. Ma Hillock, Oncologist, when he was here (I see a note from April 2013). Ct scan then showed lymphadenopathy raising concern for lymphoma.  PET scan as indicative of this as well given enlarged right axillary lymph node and retroperitoneal adenopathy.   A note from Care Everywhere shows that she had Mitral Valve Stenosis diagnosed (mild to moderate) in 09/2012.  This led to Diastolic Heart Failure and she was to get echos every two years.  This note stated she was getting chemo for Lymphoma at this time.    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  She went to the [facility] from 2013 - Jan 2016, but we cannot see any of the actual records in current EMR.    IMPRESSION: Sepsis due to HCAP HCAP Hyperlipidemia HTN CLL H/O Breast Cancer--treated in [place] in 2011 (left lumpectomy?) No info GERD Gout H/O stomach cancer--nothing is known about this  Valvular Heart Disease CHF DJD H/O kidney stones with hydronephrosis, left ureteral stent placement  and lithotripsy OSA Recent MRSA UTI Recent cough and fever treated with Levaquin from 3/11 -3/16.  H/O small bowel obstruction with lysis of adhesions surgery in 05/2013     REVIEW OF SYSTEMS:  Patient is not able to provide ROS due to confusion  SPIRITUAL SUPPORT SYSTEM: Yes.  SOCIAL HISTORY:  reports that she has never smoked. She does not have any smokeless tobacco history on file. She reports that she does not drink alcohol or use illicit drugs. She was recently discharged from here to [facility].  She is a widow and has no children.  The whole family is originally from [place].  Most moved to [place] but one sister remained in [place].  Then, pts brother moved to [place]. Then the pt came down here to [place].  Pt had been living alone and even driving until recently when it was noted that she was quite confused --in addition to being sick.   CODE STATUS: DNR  PAST MEDICAL HISTORY: Past Medical History  Diagnosis Date  . HLD (hyperlipidemia)   . GERD (gastroesophageal reflux disease)   . Kidney stone   . H/O lumpectomy   . Obstructive sleep apnea   . Gout   . Diverticulosis   . Nephrolithiasis     history of right lithotripsy  . History of valvular heart disease   . HTN (hypertension)   . Breast cancer ([place])   . CLL (chronic lymphocytic leukemia) ([place])   . Stomach cancer ([place])   . Gout   . CHF (congestive heart failure) ([place])   . Arthritis   . CLL (chronic lymphocytic leukemia) ([place])   . Anemia     PAST SURGICAL HISTORY:  Past Surgical History  Procedure Laterality Date  . Total abdominal hysterectomy w/ bilateral salpingoophorectomy    . Breast lumpectomy    . Partial hip arthroplasty      right  . Cataract extraction, bilateral    . Lithotripsy      right  . Portacath placement    . Abdominal hysterectomy      ALLERGIES:  is allergic to aspirin; ace inhibitors; eggs or egg-derived products; milk-related compounds; and whey.  MEDICATIONS:  Current Facility-Administered Medications  Medication Dose Route Frequency Provider Last Rate Last Dose  . 0.9 %  sodium chloride infusion   Intravenous Once Max Sane, MD      . acetaminophen (TYLENOL) tablet 650 mg  650 mg Oral Q6H PRN Max Sane, MD       Or  . acetaminophen (TYLENOL) suppository 650 mg  650 mg Rectal Q6H PRN Max Sane, MD      . albuterol (PROVENTIL) (2.5 MG/3ML) 0.083% nebulizer solution 2.5 mg  2.5 mg Nebulization Q6H PRN Lenis Noon, RPH      . bisacodyl (DULCOLAX) EC tablet 5  mg  5 mg Oral Daily PRN Max Sane, MD      . cefTRIAXone (ROCEPHIN) 1 g in dextrose 5 % 50 mL IVPB  1 g Intravenous Q24H Laverle Hobby, MD   1 g at 09/03/15 1109  . docusate sodium (COLACE) capsule 100 mg  100 mg Oral BID Max Sane, MD   100 mg at 09/03/15 0908  . HYDROcodone-acetaminophen (NORCO/VICODIN) 5-325 MG per tablet 1-2 tablet  1-2 tablet Oral Q4H PRN Vipul Shah, MD      . latanoprost (XALATAN) 0.005 % ophthalmic solution 1 drop  1 drop Both Eyes QHS Max Sane, MD   1 drop at 08/29/2015 2207  . mupirocin cream (BACTROBAN) 2 %   Topical BID Epifanio Lesches, MD      . ondansetron (ZOFRAN) tablet 4 mg  4 mg Oral Q6H PRN Max Sane, MD       Or  . ondansetron (ZOFRAN) injection 4 mg  4 mg Intravenous Q6H PRN Vipul Shah, MD      . pantoprazole (PROTONIX) EC tablet 40 mg  40 mg Oral Daily Max Sane, MD   40 mg at 09/03/15 0910  . simethicone (MYLICON) chewable tablet 120 mg  120 mg Oral Q6H PRN Max Sane, MD      . simvastatin (ZOCOR) tablet 20 mg  20 mg Oral QHS Max Sane, MD   20 mg at 08/25/2015 2208  . sodium chloride flush (NS) 0.9 % injection 3 mL  3 mL Intravenous Q12H Max Sane, MD   3 mL at 09/03/15 0909  . timolol (BETIMOL) 0.5 % ophthalmic solution 1 drop  1 drop Both Eyes BID Max Sane, MD   1 drop at 09/03/15 1000  . vancomycin (VANCOCIN) IVPB 750 mg/150 ml premix  750 mg Intravenous Q24H Vena Rua, RPH   750 mg at 09/03/15 1100    Vital Signs: BP 120/64 mmHg  Pulse 91  Temp(Src) 98.8 F (37.1 C) (Axillary)  Resp 30  Ht 5' 5"  (1.651 m)  Wt 73.1 kg (161 lb 2.5 oz)  BMI 26.82 kg/m2  SpO2 96% Filed Weights   08/12/2015 1125 09/01/2015 1700 09/03/15 0500  Weight: 68.04 kg (150 lb) 73 kg (160 lb 15 oz) 73.1 kg (161 lb 2.5 oz)    Estimated body mass index is 26.82 kg/(m^2) as calculated from the following:   Height as of this encounter: 5' 5"  (1.651 m).   Weight as of this encounter: 73.1 kg (161 lb 2.5 oz).  PERFORMANCE STATUS (ECOG) : 4 -  Bedbound  PHYSICAL EXAM: Lying in ICU bed Temporal wasting is noted Has eyes open and speaks a few words to me. Not oriented but says she is 'fine'  (she always says she is fine per family --but  She has a grimace noted) She is also quite askew in the bed and not able to assist in repositioning due to profound weakness EOMI Nares patent Dry lips No JVD or TM Hrt rrr no m Lungs with decreased BS in bases and some rales heard also Abd soft and NT Ext show muscle wasting    LABS: CBC:    Component Value Date/Time   WBC 22.8* 09/03/2015 1041   WBC 4.8 05/19/2014 0900  HGB 11.1* 09/03/2015 1041   HGB 10.8* 05/19/2014 0900   HCT 33.2* 09/03/2015 1041   HCT 33.5* 05/19/2014 0900   PLT 228 09/03/2015 1041   PLT 206 05/19/2014 0900   MCV 79.6* 09/03/2015 1041   MCV 86 05/19/2014 0900   NEUTROABS 22.9* 08/25/2015 2156   NEUTROABS 3.2 05/19/2014 0900   LYMPHSABS 0.1* 08/22/2015 2156   LYMPHSABS 0.9* 05/19/2014 0900   MONOABS 0.5 09/09/2015 2156   MONOABS 0.5 05/19/2014 0900   EOSABS 0.0 08/22/2015 2156   EOSABS 0.2 05/19/2014 0900   BASOSABS 0.0 09/01/2015 2156   BASOSABS 0.0 05/19/2014 0900   Comprehensive Metabolic Panel:    Component Value Date/Time   NA 140 09/03/2015 1041   NA 141 06/08/2013 0502   K 4.0 09/03/2015 1041   K 3.8 06/08/2013 0502   CL 110 09/03/2015 1041   CL 110* 06/08/2013 0502   CO2 21* 09/03/2015 1041   CO2 25 06/08/2013 0502   BUN 39* 09/03/2015 1041   BUN 5* 06/08/2013 0502   CREATININE 1.04* 09/03/2015 1041   CREATININE 1.36* 05/19/2014 0900   GLUCOSE 174* 09/03/2015 1041   GLUCOSE 90 06/08/2013 0502   CALCIUM 9.9 09/03/2015 1041   CALCIUM 10.0 12/02/2013 1426   AST 18 08/17/2015 2156   AST 12* 05/19/2014 0900   ALT 18 08/18/2015 2156   ALT 15 05/19/2014 0900   ALKPHOS 70 08/28/2015 2156   ALKPHOS 78 05/19/2014 0900   BILITOT 5.6* 08/23/2015 2156   BILITOT 0.3 05/19/2014 0900   PROT 5.1* 08/23/2015 2156   PROT 7.5 05/19/2014 0900    ALBUMIN 1.5* 08/25/2015 2156   ALBUMIN 3.5 05/19/2014 0900    More than 50% of the visit was spent in counseling/coordination of care: Yes  Time Spent: 80 minutes

## 2015-09-03 NOTE — Progress Notes (Signed)
[facility] NAME: Janet Pope    MR#:  [id number]  DATE OF BIRTH:  1927/03/14  SUBJECTIVE:admitted for possible sepsis.had fever yesterday.pt seen in [place] lethargic,unknown baseline.pt is not febrile anymore,  CHIEF COMPLAINT:   Chief Complaint  Patient presents with  . Fever    REVIEW OF SYSTEMS:   Review of Systems  Unable to perform ROS: mental acuity    DRUG ALLERGIES:   Allergies  Allergen Reactions  . Aspirin Shortness Of Breath  . Ace Inhibitors Other (See Comments)    Reaction:  Unknown   . Eggs Or Egg-Derived Products Other (See Comments)    Reaction:  Unknown   . Milk-Related Compounds Other (See Comments)    Reaction:  Unknown   . Whey Other (See Comments)    Reaction:  Unknown     VITALS:  Blood pressure 120/64, pulse 91, temperature 98.8 F (37.1 C), temperature source Axillary, resp. rate 30, height 5\' 5"  (1.651 m), weight 73.1 kg (161 lb 2.5 oz), SpO2 96 %.  PHYSICAL EXAMINATION:  GENERAL:  82 y.o.-year-old patient lying in the bed with no acute distress.  EYES: Pupils equal, round, reactive to light and accommodation. No scleral icterus. Extraocular muscles intact.  HEENT: Head atraumatic, normocephalic. Oropharynx and nasopharynx clear.  NECK:  Supple, no jugular venous distention. No thyroid enlargement, no tenderness.  LUNGS: Normal breath sounds bilaterally, no wheezing, rales,rhonchi or crepitation. No use of accessory muscles of respiration.  CARDIOVASCULAR: S1, S2 normal. No murmurs, rubs, or gallops.  ABDOMEN: Soft, nontender, nondistended. Bowel sounds present. No organomegaly or mass.  EXTREMITIES: No pedal edema, cyanosis, or clubbing.  NEUROLOGIC: unable to do full neuro exam due to lethargy.  PSYCHIATRIC: The patient  Is lethargic. SKIN: No obvious rash, lesion, or ulcer.    LABORATORY PANEL:   CBC  Recent Labs Lab 09/03/15 1041  WBC 22.8*  HGB 11.1*  HCT 33.2*   PLT 228   ------------------------------------------------------------------------------------------------------------------  Chemistries   Recent Labs Lab 08/15/2015 2156 09/03/15 1041  NA 141 140  K 4.1 4.0  CL 112* 110  CO2 23 21*  GLUCOSE 125* 174*  BUN 37* 39*  CREATININE 1.04* 1.04*  CALCIUM 9.5 9.9  AST 18  --   ALT 18  --   ALKPHOS 70  --   BILITOT 5.6*  --    ------------------------------------------------------------------------------------------------------------------  Cardiac Enzymes  Recent Labs Lab 09/03/15 1041  TROPONINI <0.03   ------------------------------------------------------------------------------------------------------------------  RADIOLOGY:  Dg Chest Port 1 View  08/24/2015  CLINICAL DATA:  Fever EXAM: PORTABLE CHEST 1 VIEW COMPARISON:  08/21/2015 FINDINGS: Stable right jugular Port-A-Cath. Normal heart size. Low lung volumes. Bibasilar atelectasis versus airspace disease right greater than left. No pneumothorax. IMPRESSION: Low lung volumes with bibasilar atelectasis versus airspace disease right greater than left. Electronically Signed   By: Marybelle Killings M.D.   On: 08/24/2015 11:09    EKG:   Orders placed or performed during the hospital encounter of 09/04/2015  . ED EKG 12-Lead  . ED EKG 12-Lead    ASSESSMENT AND PLAN:  1.sepsis present on admission due to pneumonia;continue IV ABX with Vanco and Rocephin..wbc up but she has CLL and breast Cancer. 2.HLP GERD 4.HTN;hold coreg 5,DNR Prognosis poor  Due to Breast CA and CLL.  All the records are reviewed and case discussed with Care Management/Social Workerr. Management plans discussed with the patient, family and they are in agreement.  CODE STATUS:full  TOTAL TIME  TAKING CARE OF THIS PATIENT: 48minutes.   POSSIBLE D/C IN 2-3DAYS, DEPENDING ON CLINICAL CONDITION.   Epifanio Lesches M.D on 09/03/2015 at 3:49 PM  Between 7am to 6pm - Pager - [phone]  After 6pm go  to [email or web address] - password [email or web address] [facility]  Office  [phone]  CC: Primary care physician; Pcp Not In System   Note: This dictation was prepared with Dragon dictation along with smaller phrase technology. Any transcriptional errors that result from this process are unintentional.

## 2015-09-03 NOTE — Progress Notes (Signed)
Pharmacy ICU Daily Progress Note  Janet Pope is a 82yo female admitted 08/17/2015 for HCAP/Sepsis/UTI.   Active pharmacy consults: vancomycin management  Plan: 1. Will continue vancomycin 750mg  IV q24hrs due to MRSA PCR (+) and h/o MRSA UTI. Will check vancomycin trough 3/26 at 0030.  Infectious:  Antimicrobials:  Vancomycin 3/23>> Ceftriaxone 3/24>> Cefepime 3/23>>3/24 Levofloxacin 3/23>>3/23 WBC 22.8 Afebrile PCT 4.94 (3.31) Culture Results: MRSA PCR (+)  Electrolytes:  Potassium: 4.0 Magnesium: none Phosphorus: none Supplementation plans: none  Pulmonary: Bronchodilators? albuterol Ventilator status: no O2 sat/FiO2: 96 on RA  Current steroids: none Taper plans:   GI:  Constipation PPx: docusate BID, bisacodyl prn Feeding status: heart healthy LBM: none SUP: pantoprazole 40mg  PO daily  Insulin:  SSI use in 24hrs: none Current Insulin orders: none  Last 3 CBGs: 174, 125, 150  DVT PPx: none due to anemia requiring 2 units PRBCs  Sedation+Pain:  RASS goal? 0 GCS 15 Last pain score: 0 Opioid use in last 24 hrs:   Pressors: none MAP goal:   Medication education/counseling required? none

## 2015-09-03 NOTE — Consult Note (Signed)
[facility]  [address], [place], [place] [address] Phone: [phone] Fax : [phone]  Consultation  Referring Provider:     No ref. provider found Primary Care Physician:  [place] Not In System Primary Gastroenterologist:           Reason for Consultation:     Anemia  Date of Admission:  08/24/2015 Date of Consultation:  09/03/2015         HPI:   Janet Pope is a 82 y.o. female who is a patient known with CLL and breast cancer. The patient has chronic anemia and has been receiving blood transfusions. The patient was found other fever of 101 and was admitted to the hospital. The patient has a history of UTIs and has been found to have MRSA. The patient was noted to have a hemoglobin of 7.52 days ago with 7.1 yesterday that went down to 6.8. The patient was transfused and her hemoglobin is now up to 11.1. There is been no sign of any source of GI bleeding. The patient's reticulocyte count is normal and the patient has ferritin and iron studies pending. She is not able to give any history. She has a history of GERD. I'm now being asked to see this patient for her anemia.  Past Medical History  Diagnosis Date  . HLD (hyperlipidemia)   . GERD (gastroesophageal reflux disease)   . Kidney stone   . H/O lumpectomy   . Obstructive sleep apnea   . Gout   . Diverticulosis   . Nephrolithiasis     history of right lithotripsy  . History of valvular heart disease   . HTN (hypertension)   . Breast cancer ([place])   . CLL (chronic lymphocytic leukemia) ([place])   . Stomach cancer ([place])   . Gout   . CHF (congestive heart failure) ([place])   . Arthritis   . CLL (chronic lymphocytic leukemia) ([place])   . Anemia     Past Surgical History  Procedure Laterality Date  . Total abdominal hysterectomy w/ bilateral salpingoophorectomy    . Breast lumpectomy    . Partial hip arthroplasty      right  . Cataract extraction, bilateral    . Lithotripsy      right  . Portacath  placement    . Abdominal hysterectomy      Prior to Admission medications   Medication Sig Start Date End Date Taking? Authorizing Provider  albuterol (PROVENTIL HFA;VENTOLIN HFA) 108 (90 Base) MCG/ACT inhaler Inhale 1-2 puffs into the lungs every 6 (six) hours as needed for wheezing or shortness of breath.   Yes Historical Provider, MD  carvedilol (COREG) 25 MG tablet Take 25 mg by mouth 2 (two) times daily with a meal.    Yes Historical Provider, MD  HYDROcodone-acetaminophen (NORCO) 5-325 MG tablet Take 1-2 tablets by mouth every 4 (four) hours as needed for moderate pain. 08/26/15  Yes Nicholes Mango, MD  latanoprost (XALATAN) 0.005 % ophthalmic solution Place 1 drop into both eyes at bedtime. Reported on 08/25/2015   Yes Historical Provider, MD  omeprazole (PRILOSEC) 20 MG capsule Take 20 mg by mouth daily.   Yes Historical Provider, MD  simethicone (MYLICON) 80 MG chewable tablet Chew 120 mg by mouth every 6 (six) hours as needed.   Yes Historical Provider, MD  simvastatin (ZOCOR) 20 MG tablet Take 20 mg by mouth at bedtime.    Yes Historical Provider, MD  sulfamethoxazole-trimethoprim (BACTRIM,SEPTRA) 400-80 MG tablet Take 1 tablet by  mouth every 12 (twelve) hours. 09/01/15 09/06/15 Yes Historical Provider, MD  timolol (BETIMOL) 0.5 % ophthalmic solution Place 1 drop into both eyes 2 (two) times daily.    Yes Historical Provider, MD    Family History  Problem Relation Age of Onset  . Hypertension Sister      Social History  Substance Use Topics  . Smoking status: Never Smoker   . Smokeless tobacco: None  . Alcohol Use: No    Allergies as of 08/30/2015 - Review Complete 08/19/2015  Allergen Reaction Noted  . Aspirin Shortness Of Breath 07/19/2012  . Ace inhibitors Other (See Comments) 07/19/2012  . Eggs or egg-derived products Other (See Comments) 07/19/2012  . Milk-related compounds Other (See Comments) 07/19/2012  . Whey Other (See Comments) 08/24/2015    Review of Systems:      All systems reviewed and negative except where noted in HPI.   Physical Exam:  Vital signs in last 24 hours: Temp:  [97.7 F (36.5 C)-99.7 F (37.6 C)] 98.8 F (37.1 C) (03/24 0900) Pulse Rate:  [84-97] 91 (03/24 1200) Resp:  [14-36] 30 (03/24 1200) BP: (106-142)/(50-69) 120/64 mmHg (03/24 1200) SpO2:  [96 %-100 %] 96 % (03/24 1200) Weight:  [161 lb 2.5 oz (73.1 kg)] 161 lb 2.5 oz (73.1 kg) (03/24 0500)   General:  Not able to answer any questions alert. Head:  Normocephalic and atraumatic. Eyes:   No icterus.   Conjunctiva pink. PERRLA. Ears:  Normal auditory acuity. Neck:  Supple; no masses or thyroidomegaly Lungs: Respirations even and unlabored. Lungs clear to auscultation bilaterally.   No wheezes, crackles, or rhonchi.  Heart:  Regular rate and rhythm;  Without murmur, clicks, rubs or gallops Abdomen:  Soft, nondistended, nontender. Normal bowel sounds. No appreciable masses or hepatomegaly.  No rebound or guarding.  Rectal:  Not performed. Msk:  Symmetrical without gross deformities.    Extremities:  Without edema, cyanosis or clubbing. Neurologic:  Not cooperative and nonverbal Skin:  Intact without significant lesions or rashes. Cervical Nodes:  No significant cervical adenopathy. Psych:  Alert and cooperative. Normal affect.  LAB RESULTS:  Recent Labs  09/09/2015 1147 08/31/2015 2156 09/03/15 1041  WBC 22.8* 23.5* 22.8*  HGB 7.1* 6.8* 11.1*  HCT 21.6* 21.1* 33.2*  PLT 209 224 228   BMET  Recent Labs  08/28/2015 1147 08/25/2015 2156 09/03/15 1041  NA 140 141 140  K 4.0 4.1 4.0  CL 110 112* 110  CO2 24 23 21*  GLUCOSE 150* 125* 174*  BUN 37* 37* 39*  CREATININE 0.97 1.04* 1.04*  CALCIUM 10.0 9.5 9.9   LFT  Recent Labs  08/23/2015 2156 09/03/15 1041  PROT 5.1*  --   ALBUMIN 1.5*  --   AST 18  --   ALT 18  --   ALKPHOS 70  --   BILITOT 5.6*  --   BILIDIR  --  5.3*   PT/INR  Recent Labs  09/01/2015 2156  LABPROT 24.3*  INR 2.21     STUDIES: Dg Chest Port 1 View  08/31/2015  CLINICAL DATA:  Fever EXAM: PORTABLE CHEST 1 VIEW COMPARISON:  08/21/2015 FINDINGS: Stable right jugular Port-A-Cath. Normal heart size. Low lung volumes. Bibasilar atelectasis versus airspace disease right greater than left. No pneumothorax. IMPRESSION: Low lung volumes with bibasilar atelectasis versus airspace disease right greater than left. Electronically Signed   By: Marybelle Killings M.D.   On: 08/14/2015 11:09      Impression / Plan:   Janet  DELORESE Pope is a 82 y.o. y/o female with a long-standing history of anemia requiring blood transfusions. The patient is now admitted with a fever and anemia. She is also being treated for sepsis with antibiotics. The patient's hemoglobin is now 11 after her transfusion. The patient has a history of CLL. I do not believe any GI workup is needed at this time with this patient having acute sepsis and no sign of acute GI bleeding. I will sign off now and please call if you have any questions. The patient continues to have anemia and a GI workup is deemed necessary please have her follow up as an outpatient. If her blood count continues to drop and she has any sign or source of GI bleeding seen then please contact GI again.  Thank you for involving me in the care of this patient.      LOS: 1 day   Ollen Bowl, MD  09/03/2015, 5:00 PM   Note: This dictation was prepared with Dragon dictation along with smaller phrase technology. Any transcriptional errors that result from this process are unintentional.

## 2015-09-04 DIAGNOSIS — I509 Heart failure, unspecified: Secondary | ICD-10-CM

## 2015-09-04 DIAGNOSIS — M109 Gout, unspecified: Secondary | ICD-10-CM

## 2015-09-04 DIAGNOSIS — E785 Hyperlipidemia, unspecified: Secondary | ICD-10-CM

## 2015-09-04 DIAGNOSIS — I10 Essential (primary) hypertension: Secondary | ICD-10-CM

## 2015-09-04 DIAGNOSIS — Z85028 Personal history of other malignant neoplasm of stomach: Secondary | ICD-10-CM

## 2015-09-04 DIAGNOSIS — C9110 Chronic lymphocytic leukemia of B-cell type not having achieved remission: Secondary | ICD-10-CM

## 2015-09-04 DIAGNOSIS — Z87442 Personal history of urinary calculi: Secondary | ICD-10-CM

## 2015-09-04 DIAGNOSIS — K5790 Diverticulosis of intestine, part unspecified, without perforation or abscess without bleeding: Secondary | ICD-10-CM

## 2015-09-04 DIAGNOSIS — Z853 Personal history of malignant neoplasm of breast: Secondary | ICD-10-CM

## 2015-09-04 DIAGNOSIS — M129 Arthropathy, unspecified: Secondary | ICD-10-CM

## 2015-09-04 DIAGNOSIS — J189 Pneumonia, unspecified organism: Secondary | ICD-10-CM

## 2015-09-04 DIAGNOSIS — K219 Gastro-esophageal reflux disease without esophagitis: Secondary | ICD-10-CM

## 2015-09-04 DIAGNOSIS — A419 Sepsis, unspecified organism: Principal | ICD-10-CM

## 2015-09-04 LAB — TYPE AND SCREEN
ABO/RH(D): B POS
Antibody Screen: NEGATIVE
UNIT DIVISION: 0
Unit division: 0

## 2015-09-04 LAB — URINE CULTURE: Culture: NO GROWTH

## 2015-09-04 LAB — MISC LABCORP TEST (SEND OUT): Labcorp test code: 1453

## 2015-09-04 NOTE — Progress Notes (Addendum)
CSW met with patient brother Michelle Piper at bedside. Patient is Lethargic/unresponsive. Brother is requesting for patient to discharge to [facility] due to condition. Patient brother does not have POA/Guardianship. Brother reports patient does not have any children or spouse. Brother states he plans on filing for POA. Patient was a previous resident at [facility]. CSW spoke with Shawna Clamp at [facility]. CSW faxed orders to facility at [phone], per [facility] request. CSW will continue to follow.    Anitra Lauth, BSW, MSW, [place] Work [facility] [phone]

## 2015-09-04 NOTE — Progress Notes (Signed)
Pt is alert with confusion, patients family at bedside briefly, bereavement cart delivered to room, social worker consulted with family about plans to d/c to hospice home, no new orders concerning discharge. Pt is incontinent of urine, poor appetite, minimally verbal, appears to be resting comfortable.

## 2015-09-04 NOTE — Consult Note (Signed)
[place]  [phone] [phone]  ID: Janet Pope OB: Aug 15, 1926  MR#: [id number]  SLH#:[id number]  Patient Care Team: Pcp Not In System as PCP - General Tomasita Morrow, MD (Family Medicine)  CHIEF COMPLAINT:  Chief Complaint  Patient presents with  . Fever     INTERVAL HISTORY: Patient is an 82 year old female with known history of CLL who was scheduled to have 2 units of packed red blood cells for a chronic anemia which is found to have a fever greater than 101. Currently, she is lethargic and review of systems is difficult. Patient offers no specific complaints.  REVIEW OF SYSTEMS:   Review of Systems  Unable to perform ROS: acuity of condition    As per HPI. Otherwise, a complete review of systems is negatve.  PAST MEDICAL HISTORY: Past Medical History  Diagnosis Date  . HLD (hyperlipidemia)   . GERD (gastroesophageal reflux disease)   . Kidney stone   . H/O lumpectomy   . Obstructive sleep apnea   . Gout   . Diverticulosis   . Nephrolithiasis     history of right lithotripsy  . History of valvular heart disease   . HTN (hypertension)   . Breast cancer ([place])   . CLL (chronic lymphocytic leukemia) ([place])   . Stomach cancer ([place])   . Gout   . CHF (congestive heart failure) ([place])   . Arthritis   . CLL (chronic lymphocytic leukemia) ([place])   . Anemia     PAST SURGICAL HISTORY: Past Surgical History  Procedure Laterality Date  . Total abdominal hysterectomy w/ bilateral salpingoophorectomy    . Breast lumpectomy    . Partial hip arthroplasty      right  . Cataract extraction, bilateral    . Lithotripsy      right  . Portacath placement    . Abdominal hysterectomy      FAMILY HISTORY Family History  Problem Relation Age of Onset  . Hypertension Sister        ADVANCED DIRECTIVES:    HEALTH MAINTENANCE: Social History  Substance Use Topics  . Smoking status: Never Smoker   . Smokeless tobacco: None    . Alcohol Use: No     Colonoscopy:  PAP:  Bone density:  Lipid panel:  Allergies  Allergen Reactions  . Aspirin Shortness Of Breath  . Ace Inhibitors Other (See Comments)    Reaction:  Unknown   . Eggs Or Egg-Derived Products Other (See Comments)    Reaction:  Unknown   . Milk-Related Compounds Other (See Comments)    Reaction:  Unknown   . Whey Other (See Comments)    Reaction:  Unknown     Current Facility-Administered Medications  Medication Dose Route Frequency Provider Last Rate Last Dose  . acetaminophen (TYLENOL) suppository 650 mg  650 mg Rectal Q6H PRN Vipul Shah, MD      . albuterol (PROVENTIL) (2.5 MG/3ML) 0.083% nebulizer solution 2.5 mg  2.5 mg Nebulization Q6H PRN Lenis Noon, RPH      . LORazepam (ATIVAN) tablet 0.5 mg  0.5 mg Oral Q4H PRN Colleen Can, MD      . morphine CONCENTRATE 10 MG/0.5ML oral solution 5 mg  5 mg Oral Q2H PRN Colleen Can, MD      . mupirocin cream (BACTROBAN) 2 %   Topical BID Epifanio Lesches, MD      . ondansetron [facility]) injection 4 mg  4 mg Intravenous Q6H PRN Vipul  Manuella Ghazi, MD      . prochlorperazine (COMPAZINE) suppository 25 mg  25 mg Rectal Q8H PRN Colleen Can, MD      . simethicone [employer] [employer]) chewable tablet 120 mg  120 mg Oral Q6H PRN Max Sane, MD      . sodium chloride flush (NS) 0.9 % injection 3 mL  3 mL Intravenous Q12H Max Sane, MD   3 mL at 09/03/15 2337    OBJECTIVE: Filed Vitals:   09/03/15 1200 09/03/15 1856  BP: 120/64 125/54  Pulse: 91 92  Temp:  98 F (36.7 C)  Resp: 30      Body mass index is 26.82 kg/(m^2).    ECOG FS:3 - Symptomatic, >50% confined to bed  General: Ill-appearing, no acute distress. Eyes: Pink conjunctiva, anicteric sclera. Lungs: Clear to auscultation bilaterally. Heart: Regular rate and rhythm. No rubs, murmurs, or gallops. Abdomen: Soft, nontender, nondistended. No organomegaly noted, normoactive bowel sounds. Musculoskeletal: No edema, cyanosis, or  clubbing. Neuro: Lethargic. Skin: No rashes or petechiae noted. Psych: Lethargic.   LAB RESULTS:  Lab Results  Component Value Date   NA 140 09/03/2015   K 4.0 09/03/2015   CL 110 09/03/2015   CO2 21* 09/03/2015   GLUCOSE 174* 09/03/2015   BUN 39* 09/03/2015   CREATININE 1.04* 09/03/2015   CALCIUM 9.9 09/03/2015   PROT 5.1* 09/01/2015   ALBUMIN 1.5* 08/22/2015   AST 18 08/15/2015   ALT 18 08/18/2015   ALKPHOS 70 08/12/2015   BILITOT 5.6* 08/22/2015   GFRNONAA 47* 09/03/2015   GFRAA 55* 09/03/2015    Lab Results  Component Value Date   WBC 22.8* 09/03/2015   NEUTROABS 22.9* 08/27/2015   HGB 11.1* 09/03/2015   HCT 33.2* 09/03/2015   MCV 79.6* 09/03/2015   PLT 228 09/03/2015     STUDIES: Dg Chest 2 View  08/21/2015  CLINICAL DATA:  Productive cough with fever.  Breast cancer. EXAM: CHEST  2 VIEW COMPARISON:  12/21/2014 FINDINGS: Lungs are hyperexpanded. The lungs are clear wiithout focal pneumonia, edema, pneumothorax or pleural effusion. Scarring at the right base is stable. Dense calcification in the posterior left breast is stable. Right-sided Port-A-Cath again noted. The visualized bony structures of the thorax are intact. IMPRESSION: Stable.  No acute findings. Electronically Signed   By: Misty Stanley M.D.   On: 08/21/2015 18:26   Dg Abd 1 View  08/25/2015  CLINICAL DATA:  MR screening EXAM: ABDOMEN - 1 VIEW COMPARISON:  11/10/2013 FINDINGS: BILATERAL hip prostheses. Bones demineralized. RIGHT pelvic phlebolith. Clustered nonobstructing RIGHT renal calculi 14 x 9 mm. Clothing artifacts from bra clips. No other radiopaque foreign bodies identified. IMPRESSION: Nonobstructing RIGHT renal calculi. BILATERAL hip prostheses. Electronically Signed   By: Lavonia Dana M.D.   On: 08/25/2015 11:15   Ct Head Wo Contrast  08/24/2015  CLINICAL DATA:  Slurred speech.  Weakness and confusion for 3 days. EXAM: CT HEAD WITHOUT CONTRAST TECHNIQUE: Contiguous axial images were obtained  from the base of the skull through the vertex without intravenous contrast. COMPARISON:  09/27/2011 PET FINDINGS: Sinuses/Soft tissues: No significant soft tissue swelling. Clear paranasal sinuses and mastoid air cells. Hyperostosis frontalis interna. Intracranial: Mild to moderate low density in the periventricular white matter likely related to small vessel disease. Cerebral and cerebellar volume loss is than normal limits for age. No mass lesion, hemorrhage, hydrocephalus, acute infarct, intra-axial, or extra-axial fluid collection. IMPRESSION: 1.  No acute intracranial abnormality. 2.  Cerebral atrophy and small vessel ischemic change.  Electronically Signed   By: Abigail Miyamoto M.D.   On: 08/24/2015 14:52   Mr Brain Wo Contrast  08/25/2015  CLINICAL DATA:  New onset of slurred speech and altered mental status since yesterday. Memory loss above baseline. EXAM: MRI HEAD WITHOUT CONTRAST TECHNIQUE: Multiplanar, multiecho pulse sequences of the brain and surrounding structures were obtained without intravenous contrast. COMPARISON:  CT head without contrast 08/24/2015 FINDINGS: Diffusion-weighted images demonstrate no evidence for acute or subacute infarction. Mild to moderate periventricular and subcortical white matter changes are present bilaterally. There is moderate generalized atrophy. Dilated perivascular spaces are noted in the basal ganglia bilaterally, more prominent on the left. Minimal white matter changes extend into the brainstem. The cerebellum is unremarkable. The internal auditory canals are normal. Flow is present in the major intracranial arteries. Bilateral lens replacements are present. The globes and orbits are otherwise intact. The paranasal sinuses and mastoid air cells are clear. The skullbase is within normal limits. Midline sagittal images are unremarkable. IMPRESSION: 1. No acute abnormality. 2. Mild moderate diffuse periventricular and subcortical white matter disease and moderate  generalized atrophy. This is somewhat advanced for age and likely reflects the sequela of chronic microvascular ischemia. Electronically Signed   By: San Morelle M.D.   On: 08/25/2015 11:51   US Carotid Bilateral  08/25/2015  CLINICAL DATA:  82 year old female with symptoms of stroke EXAM: BILATERAL CAROTID DUPLEX ULTRASOUND TECHNIQUE: Pearline Cables scale imaging, color Doppler and duplex ultrasound were performed of bilateral carotid and vertebral arteries in the neck. COMPARISON:  Head CT 08/23/2012 FINDINGS: Criteria: Quantification of carotid stenosis is based on velocity parameters that correlate the residual internal carotid diameter with NASCET-based stenosis levels, using the diameter of the distal internal carotid lumen as the denominator for stenosis measurement. The following velocity measurements were obtained: RIGHT ICA:  59/11 cm/sec CCA:  66/2 cm/sec SYSTOLIC ICA/CCA RATIO:  0.8 DIASTOLIC ICA/CCA RATIO:  1.6 ECA:  48 cm/sec LEFT ICA:  60/11 cm/sec CCA:  94/7 cm/sec SYSTOLIC ICA/CCA RATIO:  0.8 DIASTOLIC ICA/CCA RATIO:  1.7 ECA:  32 cm/sec RIGHT CAROTID ARTERY: Intimal medial thickening. Trace atherosclerotic plaque in the proximal internal carotid artery without evidence of luminal narrowing. RIGHT VERTEBRAL ARTERY:  Patent with normal antegrade flow. LEFT CAROTID ARTERY: Trace focal heterogeneous atherosclerotic plaque in the proximal internal carotid artery. By peak systemic velocity criteria the estimated stenosis remains less than 50%. LEFT VERTEBRAL ARTERY:  Patent with normal antegrade flow. IMPRESSION: 1. Mild (1-49%) stenosis proximal left internal carotid artery secondary to mild focal heterogeneous atherosclerotic plaque. 2. Trace atherosclerotic plaque on the right without evidence of stenosis. 3. Vertebral arteries are patent with normal antegrade flow. Signed, Criselda Peaches, MD [facility] Electronically Signed   By:  Jacqulynn Cadet M.D.   On: 08/25/2015 10:18   Dg Chest Port 1 View  08/26/2015  CLINICAL DATA:  Fever EXAM: PORTABLE CHEST 1 VIEW COMPARISON:  08/21/2015 FINDINGS: Stable right jugular Port-A-Cath. Normal heart size. Low lung volumes. Bibasilar atelectasis versus airspace disease right greater than left. No pneumothorax. IMPRESSION: Low lung volumes with bibasilar atelectasis versus airspace disease right greater than left. Electronically Signed   By: Marybelle Killings M.D.   On: 08/13/2015 11:09    ASSESSMENT: CLL, chronic anemia, fevers/sepsis.  PLAN:    1. CLL: Patient was previously seen by Dr. Ma Hillock, but had not been evaluated since December 2015. Patient completed 5 cycles of Rituxan and Treanda in May 2015, but has not required  treatment since then. Her white blood cell count is elevated on admission, but suspect this is reactive. Baseline with blood cell count seems to be within normal limits. No intervention is needed at this time. Given patient's performance status, additional treatment may be difficult. 2. Anemia: Patient was incidentally diagnosed with refractory anemia with ring sideroblasts, myelodysplastic syndrome by bone marrow biopsy on Oct 11, 2011. By report she is required outpatient blood transfusions on a routine basis. Patient received 2 units of packed blood cells during this admission and her hemoglobin is significantly improved. No further intervention is needed at this time. 3. History of breast cancer: Distant, unclear stage or treatment. 4. Pneumonia/sepsis: Continue current treatment.  Appreciate consult. If patient's performance status improves, she can follow-up in the [place] 3-4 weeks for further evaluation.  Call with questions.   Lloyd Huger, MD   09/04/2015 12:44 AM

## 2015-09-04 NOTE — Progress Notes (Signed)
[place] at [place] NAME: Khadedra Letsinger    MR#:  [id number]  DATE OF BIRTH:  29-Sep-1926  SUBJECTIVE:admitted for possible sepsis.had fever yesterday.pt seen in [place] lethargic,unknown baseline.pt is not febrile anymore, Asian thinks she is in her apartment. Disoriented. Seen by palliative care, gastroenterology, oncology. Patient family wishes to pursue comfort measures and possible hospice home placement.   CHIEF COMPLAINT:   Chief Complaint  Patient presents with  . Fever    REVIEW OF SYSTEMS:   Review of Systems  Unable to perform ROS: mental acuity    DRUG ALLERGIES:   Allergies  Allergen Reactions  . Aspirin Shortness Of Breath  . Ace Inhibitors Other (See Comments)    Reaction:  Unknown   . Eggs Or Egg-Derived Products Other (See Comments)    Reaction:  Unknown   . Milk-Related Compounds Other (See Comments)    Reaction:  Unknown   . Whey Other (See Comments)    Reaction:  Unknown     VITALS:  Blood pressure 111/51, pulse 92, temperature 97.8 F (36.6 C), temperature source Oral, resp. rate 16, height 5\' 5"  (1.651 m), weight 73.1 kg (161 lb 2.5 oz), SpO2 97 %.  PHYSICAL EXAMINATION:  GENERAL:  82 y.o.-year-old patient lying in the bed with no acute distress. Appears cachectic. EYES: Pupils equal, round, reactive to light and accommodation. No scleral icterus. Extraocular muscles intact.  HEENT: Head atraumatic, normocephalic. Oropharynx and nasopharynx clear.  NECK:  Supple, no jugular venous distention. No thyroid enlargement, no tenderness.  LUNGS: Normal breath sounds bilaterally, no wheezing, rales,rhonchi or crepitation. No use of accessory muscles of respiration.  CARDIOVASCULAR: S1, S2 normal. No murmurs, rubs, or gallops.  ABDOMEN: Soft, nontender, nondistended. Bowel sounds present. No organomegaly or mass.  EXTREMITIES: No pedal edema, cyanosis, or clubbing.  NEUROLOGIC: unable to do full neuro  exam due to lethargy.  PSYCHIATRIC: The patient  Is lethargic. SKIN: No obvious rash, lesion, or ulcer.    LABORATORY PANEL:   CBC  Recent Labs Lab 09/03/15 1041  WBC 22.8*  HGB 11.1*  HCT 33.2*  PLT 228   ------------------------------------------------------------------------------------------------------------------  Chemistries   Recent Labs Lab 08/19/2015 2156 09/03/15 1041  NA 141 140  K 4.1 4.0  CL 112* 110  CO2 23 21*  GLUCOSE 125* 174*  BUN 37* 39*  CREATININE 1.04* 1.04*  CALCIUM 9.5 9.9  AST 18  --   ALT 18  --   ALKPHOS 70  --   BILITOT 5.6*  --    ------------------------------------------------------------------------------------------------------------------  Cardiac Enzymes  Recent Labs Lab 09/03/15 1524  TROPONINI <0.03   ------------------------------------------------------------------------------------------------------------------  RADIOLOGY:  No results found.  EKG:   Orders placed or performed during the hospital encounter of 09/04/2015  . ED EKG 12-Lead  . ED EKG 12-Lead    ASSESSMENT AND PLAN:  1.sepsis present on admission due to pneumonia; is on comfort care measures now at this time according to family wishes.,  On  Ativan, morphine.  GERD 4.HTN;  Prognosis poor  Due to Breast CA and CLL. Her diagnosis includes; failure to thrive, severe malnutrition, history of CLL, diastolic heart failure, activity anemia with the myelodysplastic syndrome requiring frequent transfusions, sepsis due to pneumonia Status DO NOT RESUSCITATE  All the records are reviewed and case discussed with Care Management/Social Workerr. Management plans discussed with the patient, family and they are in agreement.  CODE STATUS:DNR  TOTAL TIME TAKING CARE OF THIS PATIENT: 72minutes.  POSSIBLE D/C IN 2-3DAYS, DEPENDING ON CLINICAL CONDITION.   Epifanio Lesches M.D on 09/04/2015 at 12:28 PM  Between 7am to 6pm - Pager - [phone]  After  6pm go to [email or web address] - password [email or web address] [place]  Office  [phone]  CC: Primary care physician; Pcp Not In System   Note: This dictation was prepared with Dragon dictation along with smaller phrase technology. Any transcriptional errors that result from this process are unintentional.

## 2015-09-04 NOTE — Progress Notes (Signed)
Nutrition Brief Note  Chart reviewed secondary to Malnutrition Screening Tool score of 2 or greater.  Pt now transitioning to comfort care. Palliative care following No further nutrition interventions warranted at this time.  Please re-consult as needed.   Geoffry Bannister B. Zenia Resides, [place], [place] (pager) Weekend/On-Call pager [phone])

## 2015-09-04 NOTE — Clinical Social Work Note (Addendum)
Clinical Social Work Assessment  Patient Details  Name: Janet Pope MRN: [id number] Date of Birth: [birth year]  Date of referral:  09/04/15               Reason for consult:  End of Life/Hospice                Permission sought to share information with:    Permission granted to share information::     Name::        Agency::     Relationship::     Contact Information:     Housing/Transportation Living arrangements for the past 2 months:  [place] of Information:  Other (Comment Required) ([employer] banker (brother) ) Patient Interpreter Needed:  None Criminal Activity/Legal Involvement Pertinent to Current Situation/Hospitalization:  No - Comment as needed Significant Relationships:  Friend, Siblings Lives with:  Facility Resident Do you feel safe going back to the place where you live?    Need for family participation in patient care:  Yes (Comment)  Care giving concerns:  Reason for consult: Palliative Care/Hospice   Social Worker assessment / plan:  Clinical Social Worker (CSW) met with patient brother Janet Pope at bedside. Patient is lethargic/unresponsive.Patient lying in bed peacefully. CSW introduced self and explained role of CSW department. Patient brother reports patient has no children or spouse. Patient brother does not have POA/Guardian. According to patient brother no one has POA/Guardianship. Brother states he plans on filing for HCPOA soon. Brother is requesting patient discharge to hospice home. Patient was a resident at [facility] prior to admission but due to current condition, brother is requesting for patient to d/c to hospice home.   CSW will continue to follow.   Employment status:  Disabled (Comment on whether or not currently receiving Disability) Insurance information:  Other (Comment Required) ([employer]) PT Recommendations:  Not assessed at this time Information / Referral to community resources:      Patient/Family's Response to care:  Brother has agreed to [facility].   Patient/Family's Understanding of and Emotional Response to Diagnosis, Current Treatment, and Prognosis: Patient brother was pleasant throughout assessment and thanked CSW for visit.  Emotional Assessment Appearance:  Appears stated age Attitude/Demeanor/Rapport:    Affect (typically observed):  Calm, Other (Lethargic, non responsive) Orientation:    Alcohol / Substance use:    Psych involvement (Current and /or in the community):  No (Comment)  Discharge Needs  Concerns to be addressed:    Readmission within the last 30 days:  Yes Current discharge risk:    Barriers to Discharge:  Continued Medical Work up   [facility]	[place], LCSW 09/04/2015, 2:09 PM

## 2015-09-05 MED ORDER — SODIUM CHLORIDE 0.9% FLUSH: 10.0000 mL | INTRAVENOUS | Status: DC | PRN

## 2015-09-05 NOTE — Progress Notes (Signed)
Pt was able to take in fluids and a few bites of sandwich last evening.  Provided fluids every hour but pt stopped trying to swallow around midnight.  Pt appeared uncomfortable and given oral morphine at 2335.  Pt resting comfortably through the early morning. Dorna Bloom RN

## 2015-09-05 NOTE — Progress Notes (Signed)
Pt is arousable but drowsy, Roxanol given once for dyspnea, no hospice home beds available today, update given to family, poss d/c to hospice home on 3/27.

## 2015-09-05 NOTE — Progress Notes (Signed)
Attempted to give pt sips of water but pt just allows fluid to roll out of mouth.  Change complete bed and gown.  Oral and skin care provided.  Pt now unresponsive and breathing is more irregular, shallow.  Pt appears comfortable per agency scale PAINAD 1/10.  Dorna Bloom RN

## 2015-09-05 NOTE — Progress Notes (Signed)
[place] at [place] NAME: Janet Pope    MR#:  [id number]  DATE OF BIRTH:  Sep 26, 1926  On comfort measures. No  New symptoms.   CHIEF COMPLAINT:   Chief Complaint  Patient presents with  . Fever    REVIEW OF SYSTEMS:   Review of Systems  Unable to perform ROS: mental acuity    DRUG ALLERGIES:   Allergies  Allergen Reactions  . Aspirin Shortness Of Breath  . Ace Inhibitors Other (See Comments)    Reaction:  Unknown   . Eggs Or Egg-Derived Products Other (See Comments)    Reaction:  Unknown   . Milk-Related Compounds Other (See Comments)    Reaction:  Unknown   . Whey Other (See Comments)    Reaction:  Unknown     VITALS:  Blood pressure 107/46, pulse 89, temperature 98 F (36.7 C), temperature source Oral, resp. rate 16, height 5\' 5"  (1.651 m), weight 73.1 kg (161 lb 2.5 oz), SpO2 98 %.  PHYSICAL EXAMINATION:  GENERAL:  82 y.o.-year-old patient lying in the bed with no acute distress. Appears cachectic.Poorly responsive. EYES: Pupils equal, round, reactive to light and accommodation. No scleral icterus. Extraocular muscles intact.  HEENT: Head atraumatic, normocephalic. Oropharynx and nasopharynx clear.  NECK:  Supple, no jugular venous distention. No thyroid enlargement, no tenderness.  LUNGS: Normal breath sounds bilaterally, no wheezing, rales,rhonchi or crepitation. No use of accessory muscles of respiration.  CARDIOVASCULAR: S1, S2 normal. No murmurs, rubs, or gallops.  ABDOMEN: Soft, nontender, nondistended. Bowel sounds present. No organomegaly or mass.  EXTREMITIES: No pedal edema, cyanosis, or clubbing.  NEUROLOGIC: unable to do full neuro exam due to lethargy.  PSYCHIATRIC: The patient  Is lethargic. SKIN: No obvious rash, lesion, or ulcer.    LABORATORY PANEL:   CBC  Recent Labs Lab 09/03/15 1041  WBC 22.8*  HGB 11.1*  HCT 33.2*  PLT 228    ------------------------------------------------------------------------------------------------------------------  Chemistries   Recent Labs Lab 08/14/2015 2156 09/03/15 1041  NA 141 140  K 4.1 4.0  CL 112* 110  CO2 23 21*  GLUCOSE 125* 174*  BUN 37* 39*  CREATININE 1.04* 1.04*  CALCIUM 9.5 9.9  AST 18  --   ALT 18  --   ALKPHOS 70  --   BILITOT 5.6*  --    ------------------------------------------------------------------------------------------------------------------  Cardiac Enzymes  Recent Labs Lab 09/03/15 1524  TROPONINI <0.03   ------------------------------------------------------------------------------------------------------------------  RADIOLOGY:  No results found.  EKG:   Orders placed or performed during the hospital encounter of 09/09/2015  . ED EKG 12-Lead  . ED EKG 12-Lead    ASSESSMENT AND PLAN:  1.sepsis present on admission due to pneumonia; is on comfort care measures now at this time according to family wishes.,  On  Ativan, morphine., Continue comfort care measures.  no  Family  At Bedside at this time.  GERD 4.HTN;  Prognosis poor  Due to Breast CA and CLL. Her diagnosis includes; failure to thrive, severe malnutrition, history of CLL, diastolic heart failure, activity anemia with the myelodysplastic syndrome requiring frequent transfusions, sepsis due to pneumonia Status DO NOT RESUSCITATE  All the records are reviewed and case discussed with Care Management/Social Workerr. Management plans discussed with the patient, family and they are in agreement.  CODE STATUS:DNR  TOTAL TIME TAKING CARE OF THIS PATIENT: 5minutes.   POSSIBLE D/C IN 2-3DAYS, DEPENDING ON CLINICAL CONDITION.   Epifanio Lesches M.D on 09/05/2015 at 10:44 AM  Between 7am to 6pm - Pager - [phone]  After 6pm go to [email or web address] - password [email or web address] [place] [facility]  Office  [phone]  CC: Primary care physician; Pcp Not In  System   Note: This dictation was prepared with Dragon dictation along with smaller phrase technology. Any transcriptional errors that result from this process are unintentional.

## 2015-09-06 MED ORDER — MORPHINE SULFATE (PF) 2 MG/ML IV SOLN
2.0000 mg | INTRAVENOUS | Status: DC
  Administered 2015-09-06 – 2015-09-07 (×3): 2 mg via INTRAVENOUS
  Filled 2015-09-06 (×3): qty 1

## 2015-09-06 MED ORDER — MORPHINE SULFATE (CONCENTRATE) 10 MG/0.5ML PO SOLN
5.0000 mg | ORAL | Status: DC | PRN
  Administered 2015-09-06: 5 mg via ORAL

## 2015-09-06 MED ORDER — MORPHINE SULFATE (PF) 4 MG/ML IV SOLN
3.0000 mg | Freq: Once | INTRAVENOUS | Status: AC
  Administered 2015-09-06: 3 mg via INTRAVENOUS
  Filled 2015-09-06: qty 1

## 2015-09-06 NOTE — Progress Notes (Addendum)
Palliative Medicine Inpatient Consult Follow Up Note   Name: RICHARD KEENEN Date: 09/06/2015 MRN: [id number]  DOB: 27-Jul-1926  Referring Physician: Gladstone Lighter, MD  Palliative Care consult requested for this 82 y.o. female for goals of medical therapy in patient with sepsis and very, very severe malnutrition and lack of any oral intake.  TODAY: I am very surprised to see pt here today ---as all was in place for pt to go to [facility] on Saturday.  Discharge meds were done with Rxs signed and all was in place for a weekend transfer once this was coordinated (I had put several orders in place to be sure this got processed over the weekend).  Perhaps there was no bed?  I am unclear on the reason.  But, whatever prevented her from going is not relavent now, since pt is too hypotensive for transport.  Hospice Liaison and I both agree she is too close to imminent death for a transfer today (though she was talking on Friday night and had stable vital signs until today).   I have spoken with a church member who was in the room about helping pt's siblings make plans and help with post-death things that must be done.  This is because I had spent hours with patients  two very elderly siblings Friday evening discussing what was best for pt---and this led me to be concerned about the wellbeing of her brother and sister also.  Perhaps that even though they are missing out on the wonderful support of Hospice Nurses, the family can be given advice about grief counseling. I also hope they can be connected with the pt's church members so that all that must be done following pts demise can be taken care of without too much of a burden on her elderly brother and sister.   Pt needs more morphine given symptoms currently. Church visitor feels this to be the case also.   IMPRESSION: Sepsis due to HCAP HCAP Hyperlipidemia HTN CLl. [place] AND MYELODYSPLASTIC SYNDROME CAUSING SEVERE ANEMIA H/O Breast  Cancer--treated in [place] in 2011 (left lumpectomy?) No info GERD Gout H/O stomach cancer--nothing is known about this --i DON'T THINK THIS IS ACCURATE Valvular Heart Disease CHF DJD H/O kidney stones with hydronephrosis, left ureteral stent placement and lithotripsy OSA Recent MRSA UTI Recent cough and fever treated with Levaquin from 3/11 -3/16.  H/O small bowel obstruction with lysis of adhesions surgery in 05/2013   REVIEW OF SYSTEMS:  Patient is not able to provide ROS due to being near death  CODE STATUS: DNR   PAST MEDICAL HISTORY: Past Medical History  Diagnosis Date  . HLD (hyperlipidemia)   . GERD (gastroesophageal reflux disease)   . Kidney stone   . H/O lumpectomy   . Obstructive sleep apnea   . Gout   . Diverticulosis   . Nephrolithiasis     history of right lithotripsy  . History of valvular heart disease   . HTN (hypertension)   . Breast cancer ([place])   . CLL (chronic lymphocytic leukemia) ([place])   . Stomach cancer ([place])   . Gout   . CHF (congestive heart failure) ([place])   . Arthritis   . CLL (chronic lymphocytic leukemia) ([place])   . Anemia     PAST SURGICAL HISTORY:  Past Surgical History  Procedure Laterality Date  . Total abdominal hysterectomy w/ bilateral salpingoophorectomy    . Breast lumpectomy    . Partial hip arthroplasty      right  .  Cataract extraction, bilateral    . Lithotripsy      right  . Portacath placement    . Abdominal hysterectomy      Vital Signs: BP 74/39 mmHg  Pulse 100  Temp(Src) 98.3 F (36.8 C) (Oral)  Resp 20  Ht 5\' 5"  (1.651 m)  Wt 73.1 kg (161 lb 2.5 oz)  BMI 26.82 kg/m2  SpO2 100% Filed Weights   08/21/2015 1125 09/09/2015 1700 09/03/15 0500  Weight: 68.04 kg (150 lb) 73 kg (160 lb 15 oz) 73.1 kg (161 lb 2.5 oz)    Estimated body mass index is 26.82 kg/(m^2) as calculated from the following:   Height as of this encounter: 5\' 5"  (1.651 m).   Weight as of this encounter: 73.1 kg (161 lb 2.5  oz).  PHYSICAL EXAM: Cheyne-Stokes respirations noted --with some intermiittent grunting per church member in the room Eyes nearly closed bilat Unresponsive No JVD or TM Hrt rrr tachy Lungs with ronchi bilat --tachypneic with C-S respirations [place] noted  More than 50% of the visit was spent in counseling/coordination of care: YES  Time Spent:  25 min

## 2015-09-06 NOTE — Progress Notes (Signed)
[place] at [place] NAME: Jetty Epple    MR#:  [id number]  DATE OF BIRTH:  12-06-1926  SUBJECTIVE:  CHIEF COMPLAINT:   Chief Complaint  Patient presents with  . Fever   - [place] admitted with sepsis and respiratory failure. -Currently on comfort care. Appears comfortable and resting. With verbal stimulation, gets anxious and hyperventilates.  REVIEW OF SYSTEMS:  Review of Systems  Unable to perform ROS: critical illness    DRUG ALLERGIES:   Allergies  Allergen Reactions  . Aspirin Shortness Of Breath  . Ace Inhibitors Other (See Comments)    Reaction:  Unknown   . Eggs Or Egg-Derived Products Other (See Comments)    Reaction:  Unknown   . Milk-Related Compounds Other (See Comments)    Reaction:  Unknown   . Whey Other (See Comments)    Reaction:  Unknown     VITALS:  Blood pressure 74/39, pulse 100, temperature 98.3 F (36.8 C), temperature source Oral, resp. rate 20, height 5\' 5"  (1.651 m), weight 73.1 kg (161 lb 2.5 oz), SpO2 100 %.  PHYSICAL EXAMINATION:  Physical Exam  GENERAL:  82 y.o.-year-old patient critically ill appearing, lying in the bed.  EYES: Pupils equal, round, reactive to light and accommodation. No scleral icterus. Extraocular muscles intact.  HEENT: Head atraumatic, normocephalic. Oropharynx and nasopharynx clear. Dry mucous membranes.  NECK:  Supple, no jugular venous distention. No thyroid enlargement, no tenderness.  LUNGS: Normal breath sounds bilaterally, no wheezing,or crepitation. No use of accessory muscles of respiration. Decreased bibasilar breath sounds. Coarse rhonchi posteriorly. CARDIOVASCULAR: S1, S2 normal. No murmurs, rubs, or gallops.  ABDOMEN: Soft, nontender, nondistended. Bowel sounds present. No organomegaly or mass.  EXTREMITIES: No pedal edema, cyanosis, or clubbing.  NEUROLOGIC: Encephalopathic, unable to cooperate for neuro exam PSYCHIATRIC: The patient is  encephalopathic  SKIN: No obvious rash, lesion, or ulcer.    LABORATORY PANEL:   CBC  Recent Labs Lab 09/03/15 1041  WBC 22.8*  HGB 11.1*  HCT 33.2*  PLT 228   ------------------------------------------------------------------------------------------------------------------  Chemistries   Recent Labs Lab 09/04/2015 2156 09/03/15 1041  NA 141 140  K 4.1 4.0  CL 112* 110  CO2 23 21*  GLUCOSE 125* 174*  BUN 37* 39*  CREATININE 1.04* 1.04*  CALCIUM 9.5 9.9  AST 18  --   ALT 18  --   ALKPHOS 70  --   BILITOT 5.6*  --    ------------------------------------------------------------------------------------------------------------------  Cardiac Enzymes  Recent Labs Lab 09/03/15 1524  TROPONINI <0.03   ------------------------------------------------------------------------------------------------------------------  RADIOLOGY:  No results found.  EKG:   Orders placed or performed during the hospital encounter of 08/19/2015  . ED EKG 12-Lead  . ED EKG 12-Lead    ASSESSMENT AND PLAN:   82 year old female with known history of CLL, transfusion dependent chronic anemia, GERD, diverticulosis, hypertension admitted for sepsis. Appreciate palliative care consult. Patient is [place] now.  She has the following diagnoses:  -Sepsis -Pneumonia -Versus a UTI -Chronic anemia-transfusion dependent -CLL -Diastolic heart failure -Mitral valve stenosis -GERD - HTN  Patient is not stable enough to be transferred to hospice home. We'll continue to monitor here.    All the records are reviewed and case discussed with Care Management/Social Workerr. Management plans discussed with the patient, family and they are in agreement.  CODE STATUS: DO NOT RESUSCITATE  TOTAL TIME TAKING CARE OF THIS PATIENT: 22 minutes.   POSSIBLE D/C IN 1-2 DAYS, DEPENDING ON  CLINICAL CONDITION.   Gladstone Lighter M.D on 09/06/2015 at 2:10 PM  Between 7am to 6pm - Pager -  [phone]  After 6pm go to [email or web address] - password [email or web address] [facility]  Office  [phone]  CC: Primary care physician; Pcp Not In System

## 2015-09-06 NOTE — Progress Notes (Signed)
New hospice home referral received over the week end. Writer was requested to follow up and meet with the family. Writer spoke to patient's staff RN Daleen Snook and assessed the patient, she is at this time too unstable to transport. VS: 74/39, 100, 45 seconds of apnea noted during visit. Attending physician notified, hospice referral notified, CSW Darden Dates made aware. Family notified by staff RN Daleen Snook and are in agreement. Chaplain to be notified per family request. Thank you. Flo Shanks RN, BSN, [facility] [place], hospital Liaison [phone] c

## 2015-09-06 NOTE — Progress Notes (Signed)
Chaplain rounded the unit and provided a compassionate presence and support with prayer to the patient and family. Minerva Fester [phone]

## 2015-09-06 NOTE — Clinical Social Work Note (Signed)
Pt is comfort care and was to be discharged to [facility]. However, per report, pt is too unstable for discharge. Pt's family is at bedside. CSW is available for support. CSW will continue to follow.   Darden Dates, MSW, LCSW Clinical Social Worker  [phone]

## 2015-09-06 NOTE — Care Management Important Message (Signed)
Important Message  Patient Details  Name: Janet Pope MRN: [id number] Date of Birth: 06-06-1927   Medicare Important Message Given:  Yes    Marshell Garfinkel, RN 09/06/2015, 1:49 PM

## 2015-09-07 ENCOUNTER — Inpatient Hospital Stay: Payer: PPO | Admitting: Hematology and Oncology

## 2015-09-07 ENCOUNTER — Other Ambulatory Visit: Payer: Self-pay | Admitting: *Deleted

## 2015-09-07 DIAGNOSIS — C8300 Small cell B-cell lymphoma, unspecified site: Secondary | ICD-10-CM

## 2015-09-07 LAB — CULTURE, BLOOD (ROUTINE X 2)
CULTURE: NO GROWTH
CULTURE: NO GROWTH

## 2015-09-07 MED ORDER — MORPHINE SULFATE (PF) 4 MG/ML IV SOLN: 3.0000 mg | INTRAVENOUS | Status: DC

## 2015-09-07 MED ORDER — MORPHINE SULFATE (PF) 4 MG/ML IV SOLN: 4.0000 mg | INTRAVENOUS | Status: DC

## 2015-09-11 NOTE — Discharge Summary (Signed)
[place] at [facility]    Death Note  ------------------------------------   Janet Pope [id number] is a 82 y.o. female, Outpatient Primary MD for the patient is Pcp Not In System   In brief:  82 year old female with known history of CLL, transfusion dependent chronic anemia, GERD, diverticulosis, hypertension admitted for sepsis. Appreciate palliative care consult. Patient was on [place] ad has passed away on 2015/10/05 at 9:58AM.  Causes of death: -Sepsis -Pneumonia - UTI -Chronic anemia-transfusion dependent -CLL -Diastolic heart failure -Mitral valve stenosis -GERD - HTN   Pronounced dead by  on   RN on 05-Oct-2015   @  09:58AM                   Devesh Monforte M.D on 10/05/2015 at 4:33 PM  [place] at [place]  Total clinical and documentation time for today Under 30 minutes

## 2015-09-11 NOTE — Progress Notes (Signed)
Patient on comfort care. Passed away at 9:58 AM this morning  Family at bedside.

## 2015-09-11 NOTE — Progress Notes (Signed)
Dr Tressia Miners made aware that pt expired at 0958, [facility] aware pt not a donar

## 2015-09-11 NOTE — Clinical Social Work Note (Signed)
Pt expired this morning. Pt's family was at bedside. CSW provided support to pt's sister and brother. CSW is signing off as no further needs identified.   Darden Dates, MSW, LCSW  Clinical Social Worker  [phone]

## 2015-09-11 NOTE — Care Management (Signed)
Admitted [facility] with the diagnosis of sepsis. A resident of [facility]. Now with Cheyne-Stokes labored respirations. Morphine IV.  Palliative Care Consult in progress. Shelbie Ammons RN MSN CCM Care Management [phone]

## 2015-09-11 NOTE — Progress Notes (Signed)
Palliative Medicine Inpatient Consult Follow Up Note   Name: Janet Pope Date: 15-Sep-2015 MRN: [id number]  DOB: June 10, 1927  Referring Physician: Gladstone Lighter, MD  Palliative Care consult requested for this 82 y.o. female for goals of medical therapy in patient with sepsis and severe malnutrition and lack of oral intake.  TODAY: She is unresponsive with hypotension documented. Respirations have changed from Cheyne-Stokes --to even but somewhat labored and louder breathing pattern. Will increase low dose morphine to 3 mg routinely IV to be sure she does not have any air hunger that her brain is registering.   IMPRESSION: Sepsis due to HCAP HCAP Hyperlipidemia HTN CLL H/O Breast Cancer--treated in [place] in 2011 (left lumpectomy?) No info GERD Gout H/O stomach cancer--nothing is known about this  Valvular Heart Disease CHF DJD H/O kidney stones with hydronephrosis, left ureteral stent placement and lithotripsy OSA Recent MRSA UTI Recent cough and fever treated with Levaquin from 3/11 -3/16.  H/O small bowel obstruction with lysis of adhesions surgery in 05/2013  REVIEW OF SYSTEMS:  Patient is not able to provide ROS due to being unresponsive  CODE STATUS: DNR   PAST MEDICAL HISTORY: Past Medical History  Diagnosis Date  . HLD (hyperlipidemia)   . GERD (gastroesophageal reflux disease)   . Kidney stone   . H/O lumpectomy   . Obstructive sleep apnea   . Gout   . Diverticulosis   . Nephrolithiasis     history of right lithotripsy  . History of valvular heart disease   . HTN (hypertension)   . Breast cancer ([place])   . CLL (chronic lymphocytic leukemia) ([place])   . Stomach cancer ([place])   . Gout   . CHF (congestive heart failure) ([place])   . Arthritis   . CLL (chronic lymphocytic leukemia) ([place])   . Anemia     PAST SURGICAL HISTORY:  Past Surgical History  Procedure Laterality Date  . Total abdominal hysterectomy w/ bilateral salpingoophorectomy     . Breast lumpectomy    . Partial hip arthroplasty      right  . Cataract extraction, bilateral    . Lithotripsy      right  . Portacath placement    . Abdominal hysterectomy      Vital Signs: BP 69/33 mmHg  Pulse 100  Temp(Src) 103.1 F (39.5 C) (Oral)  Resp 12  Ht 5\' 5"  (1.651 m)  Wt 73.1 kg (161 lb 2.5 oz)  BMI 26.82 kg/m2  SpO2 100% Filed Weights   09/01/2015 1125 08/15/2015 1700 09/03/15 0500  Weight: 68.04 kg (150 lb) 73 kg (160 lb 15 oz) 73.1 kg (161 lb 2.5 oz)    Estimated body mass index is 26.82 kg/(m^2) as calculated from the following:   Height as of this encounter: 5\' 5"  (1.651 m).   Weight as of this encounter: 73.1 kg (161 lb 2.5 oz).  PHYSICAL EXAM: Unresponsive Respirations are even, moderately labored Eyes closed hrt rrr no m Lungs w/ decreased BS Skin is cool     Time Spent: 15 min

## 2015-09-11 DEATH — deceased
